# Patient Record
Sex: Female | Born: 1960
Health system: Southern US, Community
[De-identification: ages and names within clinical notes are randomized; demographics above are authoritative.]

## PROBLEM LIST (undated history)

## (undated) DIAGNOSIS — I10 Essential (primary) hypertension: Secondary | ICD-10-CM

## (undated) DIAGNOSIS — J449 Chronic obstructive pulmonary disease, unspecified: Secondary | ICD-10-CM

## (undated) HISTORY — PX: APPENDECTOMY: SHX54

## (undated) HISTORY — PX: CHOLECYSTECTOMY: SHX55

## (undated) HISTORY — PX: TUBAL LIGATION: SHX77

## (undated) HISTORY — DX: Chronic obstructive pulmonary disease, unspecified: J44.9

---

## 2003-06-30 ENCOUNTER — Ambulatory Visit (HOSPITAL_COMMUNITY): Admission: RE | Admit: 2003-06-30 | Discharge: 2003-06-30 | Payer: Self-pay | Admitting: Family Medicine

## 2003-07-20 ENCOUNTER — Encounter: Admission: RE | Admit: 2003-07-20 | Discharge: 2003-07-20 | Payer: Self-pay | Admitting: Family Medicine

## 2003-07-20 ENCOUNTER — Ambulatory Visit (HOSPITAL_COMMUNITY): Admission: RE | Admit: 2003-07-20 | Discharge: 2003-07-20 | Payer: Self-pay | Admitting: Family Medicine

## 2007-06-09 ENCOUNTER — Emergency Department (HOSPITAL_COMMUNITY): Admission: EM | Admit: 2007-06-09 | Discharge: 2007-06-09 | Payer: Self-pay | Admitting: Emergency Medicine

## 2008-08-04 ENCOUNTER — Ambulatory Visit: Payer: Self-pay

## 2010-09-21 ENCOUNTER — Encounter: Payer: Self-pay | Admitting: Family Medicine

## 2011-02-02 ENCOUNTER — Emergency Department (HOSPITAL_COMMUNITY): Payer: Self-pay

## 2011-02-02 ENCOUNTER — Observation Stay (HOSPITAL_COMMUNITY)
Admission: EM | Admit: 2011-02-02 | Discharge: 2011-02-03 | Disposition: A | Discharge status: Home / Self Care | Payer: Self-pay | Attending: General Surgery | Admitting: General Surgery

## 2011-02-02 ENCOUNTER — Other Ambulatory Visit: Payer: Self-pay | Admitting: General Surgery

## 2011-02-02 DIAGNOSIS — I10 Essential (primary) hypertension: Secondary | ICD-10-CM | POA: Insufficient documentation

## 2011-02-02 DIAGNOSIS — K3580 Unspecified acute appendicitis: Principal | ICD-10-CM | POA: Insufficient documentation

## 2011-02-02 LAB — DIFFERENTIAL
Basophils Absolute: 0.1 10*3/uL (ref 0.0–0.1)
Basophils Relative: 0 % (ref 0–1)
Eosinophils Absolute: 0.3 10*3/uL (ref 0.0–0.7)
Eosinophils Relative: 1 % (ref 0–5)
Lymphocytes Relative: 7 % — ABNORMAL LOW (ref 12–46)
Lymphs Abs: 1.5 10*3/uL (ref 0.7–4.0)
Monocytes Absolute: 1 10*3/uL (ref 0.1–1.0)
Monocytes Relative: 5 % (ref 3–12)
Neutro Abs: 17.5 10*3/uL — ABNORMAL HIGH (ref 1.7–7.7)
Neutrophils Relative %: 86 % — ABNORMAL HIGH (ref 43–77)

## 2011-02-02 LAB — CBC
HCT: 46.8 % — ABNORMAL HIGH (ref 36.0–46.0)
Hemoglobin: 15.6 g/dL — ABNORMAL HIGH (ref 12.0–15.0)
MCH: 29.1 pg (ref 26.0–34.0)
MCHC: 33.3 g/dL (ref 30.0–36.0)
MCV: 87.3 fL (ref 78.0–100.0)
Platelets: 265 10*3/uL (ref 150–400)
RBC: 5.36 MIL/uL — ABNORMAL HIGH (ref 3.87–5.11)
RDW: 13.5 % (ref 11.5–15.5)
WBC: 20.3 10*3/uL — ABNORMAL HIGH (ref 4.0–10.5)

## 2011-02-02 LAB — URINALYSIS, ROUTINE W REFLEX MICROSCOPIC
Bilirubin Urine: NEGATIVE
Glucose, UA: NEGATIVE mg/dL
Ketones, ur: NEGATIVE mg/dL
Leukocytes, UA: NEGATIVE
Nitrite: NEGATIVE
Protein, ur: NEGATIVE mg/dL
Specific Gravity, Urine: 1.025 (ref 1.005–1.030)
Urobilinogen, UA: 0.2 mg/dL (ref 0.0–1.0)
pH: 5.5 (ref 5.0–8.0)

## 2011-02-02 LAB — COMPREHENSIVE METABOLIC PANEL
ALT: 16 U/L (ref 0–35)
AST: 17 U/L (ref 0–37)
Albumin: 4.1 g/dL (ref 3.5–5.2)
Alkaline Phosphatase: 93 U/L (ref 39–117)
BUN: 10 mg/dL (ref 6–23)
CO2: 34 mEq/L — ABNORMAL HIGH (ref 19–32)
Calcium: 10.3 mg/dL (ref 8.4–10.5)
Chloride: 100 mEq/L (ref 96–112)
Creatinine, Ser: 0.85 mg/dL (ref 0.4–1.2)
GFR calc Af Amer: >60 mL/min (ref >60)
GFR calc non Af Amer: >60 mL/min (ref >60)
Glucose, Bld: 104 mg/dL — ABNORMAL HIGH (ref 70–99)
Potassium: 3.9 mEq/L (ref 3.5–5.1)
Sodium: 140 mEq/L (ref 135–145)
Total Bilirubin: 0.3 mg/dL (ref 0.3–1.2)
Total Protein: 7.6 g/dL (ref 6.0–8.3)

## 2011-02-02 LAB — URINE MICROSCOPIC-ADD ON

## 2011-02-02 LAB — LIPASE, BLOOD: Lipase: 31 U/L (ref 11–59)

## 2011-02-02 LAB — PREGNANCY, URINE: Preg Test, Ur: NEGATIVE

## 2011-02-02 MED ORDER — IOHEXOL 300 MG/ML  SOLN
100.0000 mL | Freq: Once | INTRAMUSCULAR | Status: AC | PRN
  Administered 2011-02-02: 100 mL via INTRAVENOUS

## 2011-02-03 LAB — CBC
HCT: 40 % (ref 36.0–46.0)
Hemoglobin: 13.2 g/dL (ref 12.0–15.0)
MCH: 28.8 pg (ref 26.0–34.0)
MCHC: 33 g/dL (ref 30.0–36.0)
MCV: 87.3 fL (ref 78.0–100.0)
Platelets: 184 10*3/uL (ref 150–400)
RBC: 4.58 MIL/uL (ref 3.87–5.11)
RDW: 13.7 % (ref 11.5–15.5)
WBC: 17.1 10*3/uL — ABNORMAL HIGH (ref 4.0–10.5)

## 2011-02-03 LAB — DIFFERENTIAL
Basophils Absolute: 0 10*3/uL (ref 0.0–0.1)
Basophils Relative: 0 % (ref 0–1)
Eosinophils Absolute: 0 10*3/uL (ref 0.0–0.7)
Eosinophils Relative: 0 % (ref 0–5)
Lymphocytes Relative: 7 % — ABNORMAL LOW (ref 12–46)
Lymphs Abs: 1.2 10*3/uL (ref 0.7–4.0)
Monocytes Absolute: 0.5 10*3/uL (ref 0.1–1.0)
Monocytes Relative: 3 % (ref 3–12)
Neutro Abs: 15.4 10*3/uL — ABNORMAL HIGH (ref 1.7–7.7)
Neutrophils Relative %: 90 % — ABNORMAL HIGH (ref 43–77)

## 2011-02-03 NOTE — Op Note (Signed)
  NAME:  Kerri Walker, Kerri Walker NO.:  [id number]  MEDICAL RECORD NO.:  [id number]           PATIENT TYPE:  O  LOCATION:  A341                          FACILITY:  [facility]  PHYSICIAN:  Dalia Heading, M.D.  DATE OF BIRTH:  1960/10/09  DATE OF PROCEDURE:  02/02/2011 DATE OF DISCHARGE:                              OPERATIVE REPORT   PREOPERATIVE DIAGNOSIS:  Acute appendicitis.  POSTOPERATIVE DIAGNOSIS:  Acute appendicitis.  PROCEDURE:  Laparoscopic appendectomy.  SURGEON:  Dalia Heading, MD  ANESTHESIA:  General endotracheal.  INDICATIONS:  The patient is a 50 year old white female who presents with less than 24-hour history of worsening right lower quadrant abdominal pain.  She presented to the emergency room and was found on CT scan of the abdomen and pelvis to have acute appendicitis.  The risks and benefits of the procedure including bleeding, infection, and the possibly of an open procedure were fully explained to the patient, gave informed consent.  PROCEDURE NOTE:  The patient was placed in supine position.  After induction of general endotracheal anesthesia, the abdomen was prepped and draped using the usual sterile technique with DuraPrep.  Surgical site confirmation was performed.  A supraumbilical incision was made down to fascia.  A Veress needle was introduced into the abdominal cavity and confirmation of placement was done using the saline drop test.  The abdomen was then insufflated to 16 mmHg pressure.  An 11-mm trocar was introduced into the abdominal cavity under direct visualization without difficulty.  The patient was placed in deeper Trendelenburg position.  Additional 12-mm trocar was placed in the suprapubic region.  A 5-mm trocar was placed in the left lower quadrant region.  The appendix was visualized and noted to be with early gangrene.  There was no evidence of perforation.  The mesoappendix was divided using the harmonic scalpel.  A  vascular Endo-GIA was placed across the base of the appendix and fired.  The appendix was then removed using an Endocatch bag without difficulty.  The staple line was inspected and noted to be within normal limits.  The right lower quadrant was then evacuated of all fluid and air prior to removal of the laparoscopes.  All wounds were irrigated with normal saline.  All wounds were injected with 0.5% Sensorcaine.  The supraumbilical fascia was reapproximated using an 0 Vicryl interrupted suture.  All skin incisions were closed using staples.  Betadine ointment and dry sterile dressings were applied.  All tape and needle counts were correct at the end of the procedure. The patient was extubated in the operating room and went back to the recovery room awake in stable condition.  COMPLICATIONS:  None.  SPECIMEN:  Appendix.  BLOOD LOSS:  Minimal.     Dalia Heading, M.D.     MAJ/MEDQ  D:  02/02/2011  T:  02/03/2011  Job:  [id number]  cc:   Loma Sender, MD Fax: [phone]  Electronically Signed by Franky Macho M.D. on 02/03/2011 07:47:09 PM

## 2011-02-06 NOTE — Discharge Summary (Signed)
  NAME:  Kerri Walker, Kerri Walker NO.:  [id number]  MEDICAL RECORD NO.:  [id number]  LOCATION:  A341                          FACILITY:  [facility]  PHYSICIAN:  Dalia Heading, M.D.  DATE OF BIRTH:  [birth year]  DATE OF ADMISSION:  02/02/2011 DATE OF DISCHARGE:  06/04/2012LH                              DISCHARGE SUMMARY   HOSPITAL COURSE SUMMARY:  The patient is a 50 year old white female who presented with right lower quadrant abdominal pain to the emergency room.  She was found to have acute appendicitis by CT scan.  Surgery was consulted, and she was taken to the operating room on the same day for laparoscopic appendectomy.  She tolerated the procedure well. Postoperative course has been unremarkable.  Diet was advanced without difficulty.  The patient is being discharged home on postoperative day #1 in good improving condition.  DISCHARGE INSTRUCTIONS:  The patient is to follow up with Dr. Franky Macho on February 11, 2011.  DISCHARGE MEDICATIONS: 1. Hydrochlorothiazide 25 mg p.o. daily. 2. Augmentin 875 mg p.o. b.i.d. x5 days. 3. Percocet 1-2 tablets p.o. q.4 h p.r.n. pain.  PRINCIPAL DIAGNOSES: 1. Acute appendicitis. 2. Hypertension.  PRINCIPAL PROCEDURE:  Laparoscopic appendectomy on February 02, 2011.     Dalia Heading, M.D.     MAJ/MEDQ  D:  02/03/2011  T:  02/04/2011  Job:  [id number]  Electronically Signed by Franky Macho M.D. on 02/06/2011 06:31:36 PM

## 2011-06-12 LAB — DIFFERENTIAL
Basophils Absolute: 0.1
Basophils Relative: 1
Eosinophils Absolute: 0.4
Eosinophils Relative: 3
Lymphocytes Relative: 21
Lymphs Abs: 2.7
Monocytes Absolute: 0.7
Monocytes Relative: 5
Neutro Abs: 9.4 — ABNORMAL HIGH
Neutrophils Relative %: 71

## 2011-06-12 LAB — BASIC METABOLIC PANEL
BUN: 4 — ABNORMAL LOW
CO2: 30
Calcium: 9.1
Chloride: 98
Creatinine, Ser: 0.71
GFR calc Af Amer: >60
GFR calc non Af Amer: >60
Glucose, Bld: 95
Potassium: 3.4 — ABNORMAL LOW
Sodium: 136

## 2011-06-12 LAB — POCT CARDIAC MARKERS
CKMB, poc: <1 — ABNORMAL LOW
Myoglobin, poc: 77.4
Operator id: 230251
Troponin i, poc: <0.05

## 2011-06-12 LAB — CBC
HCT: 43.4
Hemoglobin: 14.7
MCHC: 33.9
MCV: 89.7
Platelets: 367
RBC: 4.83
RDW: 14.4 — ABNORMAL HIGH
WBC: 13.3 — ABNORMAL HIGH

## 2013-06-21 ENCOUNTER — Ambulatory Visit (HOSPITAL_COMMUNITY)
Admission: RE | Admit: 2013-06-21 | Discharge: 2013-06-21 | Disposition: A | Discharge status: Home / Self Care | Payer: Self-pay | Source: Ambulatory Visit

## 2013-06-21 ENCOUNTER — Other Ambulatory Visit (HOSPITAL_COMMUNITY): Payer: Self-pay

## 2013-06-21 DIAGNOSIS — I10 Essential (primary) hypertension: Secondary | ICD-10-CM | POA: Insufficient documentation

## 2013-06-21 DIAGNOSIS — R0602 Shortness of breath: Secondary | ICD-10-CM | POA: Insufficient documentation

## 2013-06-21 DIAGNOSIS — R0989 Other specified symptoms and signs involving the circulatory and respiratory systems: Secondary | ICD-10-CM | POA: Insufficient documentation

## 2013-06-21 DIAGNOSIS — R059 Cough, unspecified: Secondary | ICD-10-CM | POA: Insufficient documentation

## 2013-06-21 DIAGNOSIS — R05 Cough: Secondary | ICD-10-CM | POA: Insufficient documentation

## 2013-08-10 ENCOUNTER — Encounter: Payer: Self-pay | Admitting: Internal Medicine

## 2014-02-01 ENCOUNTER — Encounter: Payer: Self-pay | Admitting: Internal Medicine

## 2015-04-17 ENCOUNTER — Encounter: Payer: Self-pay | Admitting: Internal Medicine

## 2015-11-14 ENCOUNTER — Ambulatory Visit (HOSPITAL_COMMUNITY)
Admission: RE | Admit: 2015-11-14 | Discharge: 2015-11-14 | Disposition: A | Discharge status: Home / Self Care | Payer: BLUE CROSS/BLUE SHIELD | Source: Ambulatory Visit | Attending: Internal Medicine | Admitting: Internal Medicine

## 2015-11-14 ENCOUNTER — Other Ambulatory Visit (HOSPITAL_COMMUNITY): Payer: Self-pay | Admitting: Internal Medicine

## 2015-11-14 DIAGNOSIS — R0789 Other chest pain: Secondary | ICD-10-CM

## 2015-11-14 DIAGNOSIS — R079 Chest pain, unspecified: Secondary | ICD-10-CM | POA: Diagnosis not present

## 2016-03-23 ENCOUNTER — Encounter (HOSPITAL_COMMUNITY): Payer: Self-pay | Admitting: Emergency Medicine

## 2016-03-23 ENCOUNTER — Emergency Department (HOSPITAL_COMMUNITY)
Admission: EM | Admit: 2016-03-23 | Discharge: 2016-03-23 | Disposition: A | Discharge status: Home / Self Care | Payer: BLUE CROSS/BLUE SHIELD | Attending: Emergency Medicine | Admitting: Emergency Medicine

## 2016-03-23 DIAGNOSIS — L259 Unspecified contact dermatitis, unspecified cause: Secondary | ICD-10-CM

## 2016-03-23 DIAGNOSIS — I10 Essential (primary) hypertension: Secondary | ICD-10-CM | POA: Insufficient documentation

## 2016-03-23 DIAGNOSIS — F17200 Nicotine dependence, unspecified, uncomplicated: Secondary | ICD-10-CM | POA: Insufficient documentation

## 2016-03-23 DIAGNOSIS — R21 Rash and other nonspecific skin eruption: Secondary | ICD-10-CM | POA: Diagnosis present

## 2016-03-23 HISTORY — DX: Essential (primary) hypertension: I10

## 2016-03-23 MED ORDER — DEXAMETHASONE SODIUM PHOSPHATE 4 MG/ML IJ SOLN
10.0000 mg | Freq: Once | INTRAMUSCULAR | Status: AC
Start: 2016-03-23 — End: 2016-03-23
  Administered 2016-03-23: 10 mg via INTRAMUSCULAR
  Filled 2016-03-23: qty 3

## 2016-03-23 MED ORDER — PREDNISONE 10 MG PO TABS: ORAL_TABLET | ORAL | 0 refills | Status: DC

## 2016-03-23 NOTE — ED Provider Notes (Signed)
[facility]-EMERGENCY DEPT Provider Note   CSN: [id number] Arrival date & time: 03/23/16  1039  First Provider Contact:  First MD Initiated Contact with Patient 03/23/16 11:57   By signing my name below, I, Levon Hedger, attest that this documentation has been prepared under the direction and in the presence of non-physician practitioner, Artavis Cowie Triplet, PA-C. Electronically Signed: Levon Hedger, Scribe. 03/23/2016. 12:10 PM.   History   Chief Complaint Chief Complaint  Patient presents with  . Rash    HPI TANELL WITTSTRUCK is a 55 y.o. female complaining of gradually worsening itching, red rash to chest, fingers, lower back, and face onset two days ago. She has used Calamine lotion and Benadryl with no relief. She denies any new foods, medications, soaps, detergents, animal contact, or plant contact.  Pt denies trouble swallowing, facial swelling, shortness of breath. She is otherwise in her normal health and denies any history of diabetes.   The history is provided by the patient. No language interpreter was used.    Past Medical History:  Diagnosis Date  . Hypertension     There are no active problems to display for this patient.   Past Surgical History:  Procedure Laterality Date  . APPENDECTOMY    . CESAREAN SECTION    . CHOLECYSTECTOMY      OB History    No data available       Home Medications    Prior to Admission medications   Not on File    Family History No family history on file.  Social History Social History  Substance Use Topics  . Smoking status: Current Every Day Smoker    Packs/day: 1.00  . Smokeless tobacco: Never Used  . Alcohol use No     Allergies   Review of patient's allergies indicates not on file.   Review of Systems Review of Systems  Constitutional: Negative for activity change, appetite change, chills and fever.  HENT: Negative for facial swelling, sore throat and trouble swallowing.   Respiratory: Negative for chest  tightness, shortness of breath and wheezing.   Musculoskeletal: Negative for neck pain and neck stiffness.  Skin: Positive for rash. Negative for wound.  Neurological: Negative for dizziness, weakness, numbness and headaches.  All other systems reviewed and are negative.    Physical Exam Updated Vital Signs BP 152/84 (BP Location: Left Arm)   Pulse (!) 59   Temp 98.4 F (36.9 C)   Resp 16   Ht 5' (1.524 m)   Wt 135 lb (61.2 kg)   SpO2 100%   BMI 26.37 kg/m   Physical Exam  Constitutional: She is oriented to person, place, and time. Vital signs are normal. She appears well-developed and well-nourished.  Non-toxic appearance. No distress.  Afebrile, nontoxic, NAD  HENT:  Head: Normocephalic and atraumatic.  Mouth/Throat: Uvula is midline, oropharynx is clear and moist and mucous membranes are normal. No uvula swelling.  Eyes: Conjunctivae and EOM are normal. Right eye exhibits no discharge. Left eye exhibits no discharge.  Neck: Normal range of motion. Neck supple.  Cardiovascular: Normal rate and intact distal pulses.   Pulmonary/Chest: Effort normal. No respiratory distress.  Abdominal: Normal appearance.  Musculoskeletal: Normal range of motion. She exhibits no edema.  Neurological: She is alert and oriented to person, place, and time. She has normal strength. No sensory deficit.  Skin: Skin is warm, dry and intact. Rash (erythematous maculopapular rash to face, neck and upper trunk. No vesicles or pustules.  ) noted.  Psychiatric:  She has a normal mood and affect. Her behavior is normal.  Nursing note and vitals reviewed.    ED Treatments / Results  DIAGNOSTIC STUDIES:  Oxygen Saturation is 100% on RA, normal by my interpretation.    COORDINATION OF CARE:  12:04 PM Discussed treatment plan with pt at bedside and pt agreed to plan.   Procedures Procedures   Medications Ordered in ED Medications - No data to display   Initial Impression / Assessment and Plan /  ED Course  I have reviewed the triage vital signs and the nursing notes.  Pertinent labs & imaging results that were available during my care of the patient were reviewed by me and considered in my medical decision making (see chart for details).  Clinical Course   Pt with likely contact dermatitis.  Non-toxic.  No edema.  Agrees to prednisone and OTC benadryl  I personally performed the services described in this documentation, which was scribed in my presence. The recorded information has been reviewed and is accurate.   Final Clinical Impressions(s) / ED Diagnoses   Final diagnoses:  Contact dermatitis    New Prescriptions Discharge Medication List as of 03/23/2016 12:11 PM    START taking these medications   Details  predniSONE (DELTASONE) 10 MG tablet Take 6 tablets day one, 5 tablets day two, 4 tablets day three, 3 tablets day four, 2 tablets day five, then 1 tablet day six, Print         Pauline Aus, PA-C 03/24/16 2037    Mancel Bale, MD 03/25/16 [phone]

## 2016-03-23 NOTE — ED Triage Notes (Signed)
Pt c/o red itching rash to face and torso since Friday.

## 2016-03-23 NOTE — Discharge Instructions (Signed)
Continue taking benadryl one capsule every 4-6 hrs for 3-4 days.  Return here for any worsening symptoms such a swelling, difficulty swallowing or breathing

## 2017-09-22 ENCOUNTER — Other Ambulatory Visit (HOSPITAL_COMMUNITY): Payer: Self-pay | Admitting: Respiratory Therapy

## 2017-09-22 DIAGNOSIS — R0602 Shortness of breath: Secondary | ICD-10-CM

## 2017-09-23 ENCOUNTER — Other Ambulatory Visit (HOSPITAL_COMMUNITY): Payer: Self-pay | Admitting: Family Medicine

## 2017-09-23 DIAGNOSIS — Z1231 Encounter for screening mammogram for malignant neoplasm of breast: Secondary | ICD-10-CM

## 2017-10-01 ENCOUNTER — Ambulatory Visit (HOSPITAL_COMMUNITY)
Admission: RE | Admit: 2017-10-01 | Discharge: 2017-10-01 | Disposition: A | Discharge status: Home / Self Care | Payer: BLUE CROSS/BLUE SHIELD | Source: Ambulatory Visit | Attending: Internal Medicine | Admitting: Internal Medicine

## 2017-10-01 DIAGNOSIS — R0602 Shortness of breath: Secondary | ICD-10-CM | POA: Diagnosis present

## 2017-10-01 DIAGNOSIS — J449 Chronic obstructive pulmonary disease, unspecified: Secondary | ICD-10-CM | POA: Diagnosis not present

## 2017-10-01 LAB — SPIROMETRY WITH GRAPH
FEF 25-75 Pre: 0.53 L/sec
FEF2575-%Pred-Pre: 23 %
FEV1-%Pred-Pre: 46 %
FEV1-Pre: 1.07 L
FEV1FVC-%PRED-PRE: 71 %
FEV6-%PRED-PRE: 66 %
FEV6-PRE: 1.87 L
FEV6FVC-%Pred-Pre: 101 %
FVC-%Pred-Pre: 65 %
FVC-PRE: 1.91 L
PRE FEV6/FVC RATIO: 98 %
Pre FEV1/FVC ratio: 56 %

## 2017-10-07 ENCOUNTER — Ambulatory Visit (HOSPITAL_COMMUNITY): Payer: BLUE CROSS/BLUE SHIELD

## 2017-10-22 ENCOUNTER — Encounter (INDEPENDENT_AMBULATORY_CARE_PROVIDER_SITE_OTHER): Payer: Self-pay | Admitting: *Deleted

## 2017-11-05 ENCOUNTER — Other Ambulatory Visit (HOSPITAL_COMMUNITY)
Admission: RE | Admit: 2017-11-05 | Discharge: 2017-11-05 | Disposition: A | Discharge status: Home / Self Care | Payer: BLUE CROSS/BLUE SHIELD | Source: Ambulatory Visit | Attending: Adult Health | Admitting: Adult Health

## 2017-11-05 ENCOUNTER — Encounter: Payer: Self-pay | Admitting: Adult Health

## 2017-11-05 ENCOUNTER — Ambulatory Visit (INDEPENDENT_AMBULATORY_CARE_PROVIDER_SITE_OTHER): Payer: BLUE CROSS/BLUE SHIELD | Admitting: Adult Health

## 2017-11-05 VITALS — BP 134/88 | HR 70 | Ht 60.0 in | Wt 137.0 lb

## 2017-11-05 DIAGNOSIS — Z1212 Encounter for screening for malignant neoplasm of rectum: Secondary | ICD-10-CM

## 2017-11-05 DIAGNOSIS — Z01419 Encounter for gynecological examination (general) (routine) without abnormal findings: Secondary | ICD-10-CM | POA: Insufficient documentation

## 2017-11-05 DIAGNOSIS — Z1211 Encounter for screening for malignant neoplasm of colon: Secondary | ICD-10-CM

## 2017-11-05 LAB — HEMOCCULT GUIAC POC 1CARD (OFFICE): Fecal Occult Blood, POC: NEGATIVE

## 2017-11-05 NOTE — Patient Instructions (Signed)
Physical in 1 year Pap in 3 years if normal Get mammogram

## 2017-11-05 NOTE — Progress Notes (Signed)
Subjective:     Patient ID: Kerri Walker, female   DOB: Feb 08, 1961, 57 y.o.   MRN: [id number]  HPI Kerri Walker is a 57 year old white female, married, in for pap and pelvic exam. PCP is Dr Selena BattenKim.   Review of Systems Patient denies any headaches, hearing loss, fatigue, blurred vision, shortness of breath, chest pain, abdominal pain, problems with bowel movements, urination, or intercourse(not having often). No joint pain or mood swings. Reviewed past medical,surgical, social and family history. Reviewed medications and allergies.     Objective:   Physical Exam BP 134/88 (BP Location: Left Arm, Patient Position: Sitting, Cuff Size: Normal)   Pulse 70   Ht 5' (1.524 m)   Wt 137 lb (62.1 kg)   BMI 26.76 kg/m    Skin warm and dry.Pelvic: external genitalia is normal in appearance no lesions, vagina:pale, loss of rugae and moisture,urethra has no lesions or masses noted, cervix:smooth and atrophic, uterus: normal size, shape and contour, non tender, no masses felt, adnexa: no masses or tenderness noted. Bladder is non tender and no masses felt. On rectal exam, no polyps or masses felt and hemoccult is negative.  Assessment:     1. Encounter for gynecological examination with Papanicolaou smear of cervix   2. Screening for colorectal cancer       Plan:      Physical in 1 year Pap in 3 years if normal Get mammogram  Labs with PCP

## 2017-11-06 LAB — CYTOLOGY - PAP
Adequacy: ABSENT
Diagnosis: NEGATIVE
HPV: NOT DETECTED

## 2017-11-12 ENCOUNTER — Encounter (INDEPENDENT_AMBULATORY_CARE_PROVIDER_SITE_OTHER): Payer: Self-pay | Admitting: *Deleted

## 2017-12-21 ENCOUNTER — Encounter (INDEPENDENT_AMBULATORY_CARE_PROVIDER_SITE_OTHER): Payer: Self-pay | Admitting: *Deleted

## 2017-12-25 ENCOUNTER — Other Ambulatory Visit (INDEPENDENT_AMBULATORY_CARE_PROVIDER_SITE_OTHER): Payer: Self-pay | Admitting: *Deleted

## 2017-12-25 DIAGNOSIS — Z1211 Encounter for screening for malignant neoplasm of colon: Secondary | ICD-10-CM | POA: Insufficient documentation

## 2018-02-16 ENCOUNTER — Telehealth (INDEPENDENT_AMBULATORY_CARE_PROVIDER_SITE_OTHER): Payer: Self-pay | Admitting: *Deleted

## 2018-02-16 NOTE — Telephone Encounter (Signed)
Referring MD/PCP: kim   Procedure: tcs  Reason/Indication:  screening  Has patient had this procedure before?  Yes, 2004  If so, when, by whom and where?    Is there a family history of colon cancer?  no  Who?  What age when diagnosed?    Is patient diabetic?   no      Does patient have prosthetic heart valve or mechanical valve?  no  Do you have a pacemaker?  no  Has patient ever had endocarditis? no  Has patient had joint replacement within last 12 months?  no  Is patient constipated or do they take laxatives? no  Does patient have a history of alcohol/drug use?  no  Is patient on blood thinner such as Coumadin, Plavix and/or Aspirin? no  Medications: hctz 25 mg daily, losartan 25 mg daily, bupropion 150 mg daily, omeprazole 20 mg daily, montelukast 10 mg daily, spiriva  Allergies: nkda  Medication Adjustment per Dr Keane Policeehman/Terri Setzer, NP:   Procedure date & time:

## 2018-03-13 ENCOUNTER — Encounter (HOSPITAL_COMMUNITY): Payer: Self-pay | Admitting: Emergency Medicine

## 2018-03-13 ENCOUNTER — Emergency Department (HOSPITAL_COMMUNITY)
Admission: EM | Admit: 2018-03-13 | Discharge: 2018-03-13 | Disposition: A | Discharge status: Home / Self Care | Payer: BLUE CROSS/BLUE SHIELD | Attending: Emergency Medicine | Admitting: Emergency Medicine

## 2018-03-13 ENCOUNTER — Other Ambulatory Visit: Payer: Self-pay

## 2018-03-13 DIAGNOSIS — F17210 Nicotine dependence, cigarettes, uncomplicated: Secondary | ICD-10-CM | POA: Insufficient documentation

## 2018-03-13 DIAGNOSIS — Z79899 Other long term (current) drug therapy: Secondary | ICD-10-CM | POA: Diagnosis not present

## 2018-03-13 DIAGNOSIS — L237 Allergic contact dermatitis due to plants, except food: Secondary | ICD-10-CM | POA: Diagnosis not present

## 2018-03-13 DIAGNOSIS — I10 Essential (primary) hypertension: Secondary | ICD-10-CM | POA: Insufficient documentation

## 2018-03-13 DIAGNOSIS — J449 Chronic obstructive pulmonary disease, unspecified: Secondary | ICD-10-CM | POA: Insufficient documentation

## 2018-03-13 MED ORDER — FAMOTIDINE 20 MG PO TABS: 20.0000 mg | ORAL_TABLET | Freq: Two times a day (BID) | ORAL | 0 refills | Status: DC

## 2018-03-13 MED ORDER — PREDNISONE 10 MG PO TABS: ORAL_TABLET | ORAL | 0 refills | Status: DC

## 2018-03-13 MED ORDER — DEXAMETHASONE SODIUM PHOSPHATE 10 MG/ML IJ SOLN
10.0000 mg | Freq: Once | INTRAMUSCULAR | Status: AC
  Administered 2018-03-13: 10 mg via INTRAMUSCULAR
  Filled 2018-03-13: qty 1

## 2018-03-13 NOTE — ED Triage Notes (Signed)
Patient c/o being exposed to poison ivy and breaking out in rash. Per patient started after working outside on Wednesday. Per patient itching. Per patient using over-the-counter medication with some relief.

## 2018-03-13 NOTE — Discharge Instructions (Addendum)
Take your next dose of the prednisone prescription tomorrow morning.  Start taking Pepcid twice daily which will help with this histamine reaction as discussed.  In addition continue using Benadryl for itch relief.  Cool compresses to especially itchy areas and/or anti-itch cream can also be helpful for itchy areas.  Get rechecked here or by your doctor for any worsening or persistent symptoms.  You should start noticing improvement within the next 1 to 2 days as this prednisone becomes effective.  As discussed, your blood pressure is elevated today and this may be because you just took your blood pressure medication, however should have your blood pressure rechecked within the next week to make sure it is under better control.

## 2018-03-13 NOTE — ED Notes (Signed)
Working in [place] [employer], burned some brush  Now with several  Day hx of rash that itches and continues to spread on body Rash to hands legs, chest and face  States that she has used OTC meds and creams without relief

## 2018-03-15 NOTE — ED Provider Notes (Signed)
[facility] EMERGENCY DEPARTMENT Provider Note   CSN: [id number] Arrival date & time: 03/13/18  1118     History   Chief Complaint Chief Complaint  Patient presents with  . Poison Ivy    HPI Kerri Walker is a 57 y.o. female.  The history is provided by the patient.  Poison Kerri Walker  This is a new problem. The current episode started more than 2 days ago (she burned brush which probably contained poison ivy 3 days ago.). The problem occurs constantly. The problem has not changed since onset.Pertinent negatives include no shortness of breath. Exacerbated by: gets worse when outside in the heat. The symptoms are relieved by medications (oartial relief with benadryl. has also used calamine lotion).    Past Medical History:  Diagnosis Date  . COPD (chronic obstructive pulmonary disease) (HCC)   . Hypertension     Patient Active Problem List   Diagnosis Date Noted  . Special screening for malignant neoplasms, colon 12/25/2017  . Encounter for gynecological examination with Papanicolaou smear of cervix 11/05/2017    Past Surgical History:  Procedure Laterality Date  . APPENDECTOMY    . CESAREAN SECTION    . CHOLECYSTECTOMY    . TUBAL LIGATION       OB History    Gravida  2   Para  2   Term  2   Preterm      AB      Living  1     SAB      TAB      Ectopic      Multiple      Live Births  1            Home Medications    Prior to Admission medications   Medication Sig Start Date End Date Taking? Authorizing Provider  buPROPion (ZYBAN) 150 MG 12 hr tablet Take 150 mg by mouth 2 (two) times daily.  10/08/17   [provider]  famotidine (PEPCID) 20 MG tablet Take 1 tablet (20 mg total) by mouth 2 (two) times daily. 03/13/18   Burgess Amor, PA-C  fluticasone (FLONASE) 50 MCG/ACT nasal spray PLACE 2 SPRAYS INTO [facility] NOSTRIL QD 08/17/17   [provider]  hydrochlorothiazide (HYDRODIURIL) 25 MG tablet TK 1 T PO QD FOR BP 10/16/17    [provider]  losartan (COZAAR) 25 MG tablet TK 1 T PO QD FOR BP 10/09/17   [provider]  omeprazole (PRILOSEC) 20 MG capsule TK 1 C PO QD FOR REFLUX 10/14/17   [provider]  predniSONE (DELTASONE) 10 MG tablet Take 6 tabs daily by mouth for 2 day,  Then 5 tabs daily for 2 days,  4 tabs daily for 2 days,  3 tabs daily for 2 days,  2 tabs daily for 2 days,  Then 1 tab daily for 2 days. 03/13/18   Burgess Amor, PA-C  SPIRIVA RESPIMAT 2.5 MCG/ACT AERS INL 2 PFS ITL QD 10/08/17   [provider]  traZODone (DESYREL) 50 MG tablet TK 1 TO 2 TS PO HS FOR SLP 10/07/17   [provider]    Family History Family History  Problem Relation Age of Onset  . Emphysema Paternal Grandfather   . Stroke Paternal Grandmother   . Dementia Father   . Miscarriages / [place] Daughter     Social History Social History   Tobacco Use  . Smoking status: Current Every Day Smoker    Packs/day: 0.00  Years: 43.00    Pack years: 0.00    Types: Cigarettes  . Smokeless tobacco: Never Used  Substance Use Topics  . Alcohol use: Yes    Comment: rarely  . Drug use: No     Allergies   Patient has no known allergies.   Review of Systems Review of Systems  Constitutional: Negative for chills and fever.  Respiratory: Negative for shortness of breath and wheezing.   Skin: Positive for rash.  Neurological: Negative for numbness.     Physical Exam Updated Vital Signs BP (!) 161/92 (BP Location: Left Arm)   Pulse (!) 57   Temp 97.9 F (36.6 C) (Oral)   Resp 16   Ht 5' (1.524 m)   Wt 61.2 kg (135 lb)   SpO2 98%   BMI 26.37 kg/m   Physical Exam  Constitutional: She appears well-developed and well-nourished. No distress.  HENT:  Head: Normocephalic.  Neck: Neck supple.  Cardiovascular: Normal rate.  Pulmonary/Chest: Effort normal. She has no wheezes.  Musculoskeletal: Normal range of motion. She exhibits no edema.  Skin: Skin is warm. Rash noted.  Rash is vesicular.  Scattered vesicular rash on hands, forearms, upper chest and face. No drainage. Excoriations present.     ED Treatments / Results  Labs (all labs ordered are listed, but only abnormal results are displayed) Labs Reviewed - No data to display  EKG None  Radiology No results found.  Procedures Procedures (including critical care time)  Medications Ordered in ED Medications  dexamethasone (DECADRON) injection 10 mg (10 mg Intramuscular Given 03/13/18 1154)     Initial Impression / Assessment and Plan / ED Course  I have reviewed the triage vital signs and the nursing notes.  Pertinent labs & imaging results that were available during my care of the patient were reviewed by me and considered in my medical decision making (see chart for details).     Steroids, pepcid, continued benadryl. Advised cool compresses, anti itch cream, calamine if areas become wet or draining.  Vesicles currently tiny and intact. No signs of superficial infection.  Discussed other home tx for sx relief. Prn f/u with pcp.  Final Clinical Impressions(s) / ED Diagnoses   Final diagnoses:  Allergic dermatitis due to poison ivy    ED Discharge Orders        Ordered    predniSONE (DELTASONE) 10 MG tablet     03/13/18 1153    famotidine (PEPCID) 20 MG tablet  2 times daily     03/13/18 1153       Burgess Amordol, Raijon Lindfors, PA-C 03/15/18 1600    Donnetta Hutchingook, Brian, MD 03/16/18 [phone]

## 2018-04-07 ENCOUNTER — Encounter (HOSPITAL_COMMUNITY): Admission: RE | Payer: Self-pay | Source: Ambulatory Visit

## 2018-04-07 ENCOUNTER — Ambulatory Visit (HOSPITAL_COMMUNITY)
Admission: RE | Admit: 2018-04-07 | Payer: BLUE CROSS/BLUE SHIELD | Source: Ambulatory Visit | Admitting: Internal Medicine

## 2018-04-07 SURGERY — COLONOSCOPY
Anesthesia: Moderate Sedation

## 2018-06-23 ENCOUNTER — Encounter (HOSPITAL_COMMUNITY): Payer: Self-pay | Admitting: Emergency Medicine

## 2018-06-23 ENCOUNTER — Emergency Department (HOSPITAL_COMMUNITY)
Admission: EM | Admit: 2018-06-23 | Discharge: 2018-06-23 | Disposition: A | Discharge status: Home / Self Care | Payer: BLUE CROSS/BLUE SHIELD | Attending: Emergency Medicine | Admitting: Emergency Medicine

## 2018-06-23 ENCOUNTER — Other Ambulatory Visit: Payer: Self-pay

## 2018-06-23 DIAGNOSIS — Y93H2 Activity, gardening and landscaping: Secondary | ICD-10-CM | POA: Insufficient documentation

## 2018-06-23 DIAGNOSIS — F17210 Nicotine dependence, cigarettes, uncomplicated: Secondary | ICD-10-CM | POA: Insufficient documentation

## 2018-06-23 DIAGNOSIS — Y92017 Garden or yard in single-family (private) house as the place of occurrence of the external cause: Secondary | ICD-10-CM | POA: Diagnosis not present

## 2018-06-23 DIAGNOSIS — T161XXA Foreign body in right ear, initial encounter: Secondary | ICD-10-CM

## 2018-06-23 DIAGNOSIS — I10 Essential (primary) hypertension: Secondary | ICD-10-CM | POA: Diagnosis not present

## 2018-06-23 DIAGNOSIS — X58XXXA Exposure to other specified factors, initial encounter: Secondary | ICD-10-CM | POA: Diagnosis not present

## 2018-06-23 DIAGNOSIS — J449 Chronic obstructive pulmonary disease, unspecified: Secondary | ICD-10-CM | POA: Diagnosis not present

## 2018-06-23 DIAGNOSIS — Z79899 Other long term (current) drug therapy: Secondary | ICD-10-CM | POA: Insufficient documentation

## 2018-06-23 DIAGNOSIS — Y999 Unspecified external cause status: Secondary | ICD-10-CM | POA: Diagnosis not present

## 2018-06-23 NOTE — Discharge Instructions (Addendum)
Return as needed

## 2018-06-23 NOTE — ED Triage Notes (Signed)
Pt c/o bug in right ear, noticed today.

## 2018-06-23 NOTE — ED Notes (Signed)
Ear irrigated and small insect retreived

## 2018-06-23 NOTE — ED Provider Notes (Signed)
[facility] EMERGENCY DEPARTMENT Provider Note   CSN: [id number] Arrival date & time: 06/23/18  1331     History   Chief Complaint Chief Complaint  Patient presents with  . Otalgia    HPI Kerri Walker is a 57 y.o. female.  HPI   Kerri Walker is a 57 y.o. female who presents to the Emergency Department complaining of foreign body sensation to right ear.  States that she was working outside when she felt an insect fly inside her ear.  She complains of discomfort to her ear and intermittent "moving and scratching" inside her ear.  Denies bleeding, decreased hearing and other symptoms  Past Medical History:  Diagnosis Date  . COPD (chronic obstructive pulmonary disease) (HCC)   . Hypertension     Patient Active Problem List   Diagnosis Date Noted  . Special screening for malignant neoplasms, colon 12/25/2017  . Encounter for gynecological examination with Papanicolaou smear of cervix 11/05/2017    Past Surgical History:  Procedure Laterality Date  . APPENDECTOMY    . CESAREAN SECTION    . CHOLECYSTECTOMY    . TUBAL LIGATION       OB History    Gravida  2   Para  2   Term  2   Preterm      AB      Living  1     SAB      TAB      Ectopic      Multiple      Live Births  1            Home Medications    Prior to Admission medications   Medication Sig Start Date End Date Taking? Authorizing Provider  buPROPion (ZYBAN) 150 MG 12 hr tablet Take 150 mg by mouth 2 (two) times daily.  10/08/17   [provider]  famotidine (PEPCID) 20 MG tablet Take 1 tablet (20 mg total) by mouth 2 (two) times daily. 03/13/18   Burgess Amor, PA-C  fluticasone (FLONASE) 50 MCG/ACT nasal spray PLACE 2 SPRAYS INTO [facility] NOSTRIL QD 08/17/17   [provider]  hydrochlorothiazide (HYDRODIURIL) 25 MG tablet TK 1 T PO QD FOR BP 10/16/17   [provider]  losartan (COZAAR) 25 MG tablet TK 1 T PO QD FOR BP 10/09/17   [provider]    omeprazole (PRILOSEC) 20 MG capsule TK 1 C PO QD FOR REFLUX 10/14/17   [provider]  predniSONE (DELTASONE) 10 MG tablet Take 6 tabs daily by mouth for 2 day,  Then 5 tabs daily for 2 days,  4 tabs daily for 2 days,  3 tabs daily for 2 days,  2 tabs daily for 2 days,  Then 1 tab daily for 2 days. 03/13/18   Burgess Amor, PA-C  SPIRIVA RESPIMAT 2.5 MCG/ACT AERS INL 2 PFS ITL QD 10/08/17   [provider]  traZODone (DESYREL) 50 MG tablet TK 1 TO 2 TS PO HS FOR SLP 10/07/17   [provider]    Family History Family History  Problem Relation Age of Onset  . Emphysema Paternal Grandfather   . Stroke Paternal Grandmother   . Dementia Father   . Miscarriages / [place] Daughter     Social History Social History   Tobacco Use  . Smoking status: Current Every Day Smoker    Packs/day: 0.00    Years: 43.00    Pack years: 0.00    Types: Cigarettes  .  Smokeless tobacco: Never Used  Substance Use Topics  . Alcohol use: Yes    Comment: rarely  . Drug use: No     Allergies   Patient has no known allergies.   Review of Systems Review of Systems  Constitutional: Negative for fever.  HENT: Positive for ear pain. Negative for congestion, ear discharge, facial swelling and hearing loss.   Skin: Negative for rash.  Neurological: Negative for dizziness and headaches.     Physical Exam Updated Vital Signs BP (!) 156/88 (BP Location: Right Arm)   Pulse 67   Temp 97.9 F (36.6 C) (Oral)   Resp 18   SpO2 99%   Physical Exam  Constitutional: She appears well-developed and well-nourished. No distress.  Pt is calm, no distress  HENT:  Head: Atraumatic.  Right Ear: Tympanic membrane normal. A foreign body is present.  Left Ear: Tympanic membrane normal.  Mouth/Throat: Uvula is midline, oropharynx is clear and moist and mucous membranes are normal.  Very small insect in the right ear canal.  No bleeding or abrasions to the canal  Neck: Normal range of  motion. Neck supple.  Cardiovascular: Normal rate and regular rhythm.  Pulmonary/Chest: Effort normal and breath sounds normal. No respiratory distress.  Musculoskeletal: Normal range of motion.  Neurological: She is alert.  Skin: Skin is warm.  Psychiatric: She has a normal mood and affect.  Nursing note and vitals reviewed.    ED Treatments / Results  Labs (all labs ordered are listed, but only abnormal results are displayed) Labs Reviewed - No data to display  EKG None  Radiology No results found.  Procedures Procedures (including critical care time)  Medications Ordered in ED Medications - No data to display   Initial Impression / Assessment and Plan / ED Course  I have reviewed the triage vital signs and the nursing notes.  Pertinent labs & imaging results that were available during my care of the patient were reviewed by me and considered in my medical decision making (see chart for details).     Small insect irrigated from right ear w/o difficulty by nursing staff.  Irrigated with nml saline.    On recheck, TM remains intact  Final Clinical Impressions(s) / ED Diagnoses   Final diagnoses:  Foreign body of right ear, initial encounter    ED Discharge Orders    None       Pauline Aus, PA-C 06/26/18 1610    Raeford Razor, MD 06/26/18 [phone]

## 2019-11-28 ENCOUNTER — Other Ambulatory Visit: Payer: Self-pay

## 2019-11-28 ENCOUNTER — Ambulatory Visit: Payer: Self-pay | Attending: Internal Medicine

## 2019-11-28 DIAGNOSIS — Z20822 Contact with and (suspected) exposure to covid-19: Secondary | ICD-10-CM | POA: Insufficient documentation

## 2019-11-30 LAB — NOVEL CORONAVIRUS, NAA: SARS-CoV-2, NAA: NOT DETECTED

## 2019-11-30 LAB — SARS-COV-2, NAA 2 DAY TAT

## 2019-12-01 ENCOUNTER — Telehealth: Payer: Self-pay

## 2019-12-01 NOTE — Telephone Encounter (Signed)
Pt. Given COVID 19 results, verbalizes understanding. 

## 2021-01-14 ENCOUNTER — Emergency Department (HOSPITAL_COMMUNITY): Payer: 59

## 2021-01-14 ENCOUNTER — Other Ambulatory Visit: Payer: Self-pay

## 2021-01-14 ENCOUNTER — Emergency Department (HOSPITAL_COMMUNITY)
Admission: EM | Admit: 2021-01-14 | Discharge: 2021-01-14 | Disposition: A | Discharge status: Home / Self Care | Payer: 59 | Attending: Emergency Medicine | Admitting: Emergency Medicine

## 2021-01-14 ENCOUNTER — Encounter (HOSPITAL_COMMUNITY): Payer: Self-pay | Admitting: *Deleted

## 2021-01-14 DIAGNOSIS — J449 Chronic obstructive pulmonary disease, unspecified: Secondary | ICD-10-CM | POA: Diagnosis not present

## 2021-01-14 DIAGNOSIS — S52125A Nondisplaced fracture of head of left radius, initial encounter for closed fracture: Secondary | ICD-10-CM | POA: Diagnosis not present

## 2021-01-14 DIAGNOSIS — W19XXXA Unspecified fall, initial encounter: Secondary | ICD-10-CM | POA: Diagnosis not present

## 2021-01-14 DIAGNOSIS — I10 Essential (primary) hypertension: Secondary | ICD-10-CM | POA: Diagnosis not present

## 2021-01-14 DIAGNOSIS — F17210 Nicotine dependence, cigarettes, uncomplicated: Secondary | ICD-10-CM | POA: Diagnosis not present

## 2021-01-14 DIAGNOSIS — Z79899 Other long term (current) drug therapy: Secondary | ICD-10-CM | POA: Insufficient documentation

## 2021-01-14 DIAGNOSIS — S59912A Unspecified injury of left forearm, initial encounter: Secondary | ICD-10-CM | POA: Diagnosis present

## 2021-01-14 MED ORDER — ACETAMINOPHEN 325 MG PO TABS
650.0000 mg | ORAL_TABLET | Freq: Once | ORAL | Status: AC
  Administered 2021-01-14: 650 mg via ORAL
  Filled 2021-01-14: qty 2

## 2021-01-14 NOTE — ED Provider Notes (Addendum)
[facility] EMERGENCY DEPARTMENT Provider Note   CSN: [id number] Arrival date & time: 01/14/21  1040     History Chief Complaint  Patient presents with  . Fall    Kerri Walker is a 60 y.o. female.  Patient presents ER chief complaint of left forearm pain.  She states that she fell onto her left forearm 2 days ago while her husband was trying to set her up.  She denies headache or neck pain or back pain.  Has been having consistent left forearm pain and presents to the ER for evaluation.  Denies fevers or cough vomiting or diarrhea.        Past Medical History:  Diagnosis Date  . COPD (chronic obstructive pulmonary disease) (HCC)   . Hypertension     Patient Active Problem List   Diagnosis Date Noted  . Special screening for malignant neoplasms, colon 12/25/2017  . Encounter for gynecological examination with Papanicolaou smear of cervix 11/05/2017    Past Surgical History:  Procedure Laterality Date  . APPENDECTOMY    . CESAREAN SECTION    . CHOLECYSTECTOMY    . TUBAL LIGATION       OB History    Gravida  2   Para  2   Term  2   Preterm      AB      Living  1     SAB      IAB      Ectopic      Multiple      Live Births  1           Family History  Problem Relation Age of Onset  . Emphysema Paternal Grandfather   . Stroke Paternal Grandmother   . Dementia Father   . Miscarriages / [place] Daughter     Social History   Tobacco Use  . Smoking status: Current Every Day Smoker    Packs/day: 0.00    Years: 43.00    Pack years: 0.00    Types: Cigarettes  . Smokeless tobacco: Never Used  Vaping Use  . Vaping Use: Never used  Substance Use Topics  . Alcohol use: Yes    Comment: rarely  . Drug use: No    Home Medications Prior to Admission medications   Medication Sig Start Date End Date Taking? Authorizing Provider  albuterol (VENTOLIN HFA) 108 (90 Base) MCG/ACT inhaler Inhale 2 puffs into the lungs every 4 (four)  hours as needed for wheezing or shortness of breath. 01/08/21  Yes [provider]  hydrochlorothiazide (HYDRODIURIL) 25 MG tablet Take 25 mg by mouth daily. 10/16/17  Yes [provider]  losartan (COZAAR) 25 MG tablet Take 25 mg by mouth daily. 10/09/17  Yes [provider]  montelukast (SINGULAIR) 10 MG tablet Take 1 tablet by mouth daily. 12/19/20  Yes [provider]  SPIRIVA RESPIMAT 2.5 MCG/ACT AERS Inhale 2 puffs into the lungs daily. 10/08/17  Yes [provider]  traZODone (DESYREL) 50 MG tablet Take 50-100 mg by mouth at bedtime. 10/07/17  Yes [provider]  famotidine (PEPCID) 20 MG tablet Take 1 tablet (20 mg total) by mouth 2 (two) times daily. Patient not taking: No sig reported 03/13/18   Burgess Amor, PA-C  predniSONE (DELTASONE) 10 MG tablet Take 6 tabs daily by mouth for 2 day,  Then 5 tabs daily for 2 days,  4 tabs daily for 2 days,  3 tabs daily for 2 days,  2 tabs daily  for 2 days,  Then 1 tab daily for 2 days. Patient not taking: No sig reported 03/13/18   Burgess Amor, PA-C    Allergies    Patient has no known allergies.  Review of Systems   Review of Systems  Constitutional: Negative for fever.  HENT: Negative for ear pain.   Eyes: Negative for pain.  Respiratory: Negative for cough.   Cardiovascular: Negative for chest pain.  Gastrointestinal: Negative for abdominal pain.  Genitourinary: Negative for flank pain.  Musculoskeletal: Negative for back pain.  Skin: Negative for rash.  Neurological: Negative for headaches.    Physical Exam Updated Vital Signs BP (!) 115/98 (BP Location: Right Arm)   Pulse 66   Temp 98.6 F (37 C) (Oral)   Resp 14   Ht 5' (1.524 m)   Wt 69.4 kg   SpO2 96%   BMI 29.88 kg/m   Physical Exam Constitutional:      General: She is not in acute distress.    Appearance: Normal appearance.  HENT:     Head: Normocephalic.     Nose: Nose normal.  Eyes:     Extraocular Movements:  Extraocular movements intact.  Cardiovascular:     Rate and Rhythm: Normal rate.  Pulmonary:     Effort: Pulmonary effort is normal.  Musculoskeletal:        General: Normal range of motion.     Cervical back: Normal range of motion.     Comments: Patient able to range her left elbow with mild to moderate pain.  She states is unclear where the maximal area of tenderness is.  Otherwise neurovascularly intact and compartments are soft.  Neurological:     General: No focal deficit present.     Mental Status: She is alert. Mental status is at baseline.     ED Results / Procedures / Treatments   Labs (all labs ordered are listed, but only abnormal results are displayed) Labs Reviewed - No data to display  EKG None  Radiology DG Elbow Complete Left  Result Date: 01/14/2021 CLINICAL DATA:  Pain.  Fall 2 days ago. EXAM: LEFT ELBOW - COMPLETE 3+ VIEW COMPARISON:  None. FINDINGS: Acute nondisplaced fracture of the radial neck. Large joint effusion. Mild degenerative changes with marginal joint osteophytes. IMPRESSION: Acute nondisplaced fracture of the radial neck with a large joint effusion. Electronically Signed   By: Feliberto Harts MD   On: 01/14/2021 13:00   DG Forearm Left  Result Date: 01/14/2021 CLINICAL DATA:  Left elbow/arm pain after a fall 2 days ago. EXAM: LEFT FOREARM - 2 VIEW COMPARISON:  None. FINDINGS: There is an elbow joint effusion. There is slight cortical irregularity of the radial head without a displaced fracture identified on this nondedicated study. The remainder of the radius and entirety of the ulna appear intact. The wrist and elbow are located. IMPRESSION: Elbow joint effusion. Dedicated elbow radiographs are recommended to further evaluate for a possible underlying radial head fracture. Electronically Signed   By: Sebastian Ache M.D.   On: 01/14/2021 12:00    Procedures .Ortho Injury Treatment  Date/Time: 01/14/2021 1:33 PM Performed by: Cheryll Cockayne,  MD Authorized by: Cheryll Cockayne, MD   Consent:    Consent obtained:  Verbal   Consent given by:  PatientPost-procedure neurovascular assessment: post-procedure neurovascularly intact Comments: Left UE sugar tong placed.      Medications Ordered in ED Medications  acetaminophen (TYLENOL) tablet 650 mg (650 mg Oral Given 01/14/21 1101)  ED Course  I have reviewed the triage vital signs and the nursing notes.  Pertinent labs & imaging results that were available during my care of the patient were reviewed by me and considered in my medical decision making (see chart for details).    MDM Rules/Calculators/A&P                          X-rays done of the left forearm left elbow.  Consistent with a left radial head fracture.  Patient placed in a sugar-tong splint, neurovascularly intact after placement, advised follow-up with orthopedic surgery within 3 to 5 days, advised immediate return for worsening pain fevers or any additional concerns.   Final Clinical Impression(s) / ED Diagnoses Final diagnoses:  Closed nondisplaced fracture of head of left radius, initial encounter    Rx / DC Orders ED Discharge Orders    None       Cheryll Cockayne, MD 01/14/21 1332    Cheryll Cockayne, MD 01/14/21 [phone]

## 2021-01-14 NOTE — ED Triage Notes (Signed)
Pt fell 2 days ago and now pain to left elbow, able to straighten out arm but with pain. Denies hitting her head.

## 2021-01-14 NOTE — Discharge Instructions (Addendum)
Call your primary care doctor or specialist as discussed in the next 2-3 days.   Return immediately back to the ER if:  Your symptoms worsen within the next 12-24 hours. You develop new symptoms such as new fevers, persistent vomiting, new pain, shortness of breath, or new weakness or numbness, or if you have any other concerns.  

## 2021-01-16 ENCOUNTER — Telehealth: Payer: Self-pay | Admitting: Orthopedic Surgery

## 2021-01-16 NOTE — Telephone Encounter (Signed)
Called back to patient to relay and to offer appointment; awaiting call back; will call back if no response.

## 2021-01-16 NOTE — Telephone Encounter (Signed)
Reached patient; scheduled accordingly; aware of appointment. °

## 2021-01-16 NOTE — Telephone Encounter (Signed)
Initial call received from patient this afternoon, 01/16/21, following emergency room visit at [facility] on 01/14/21; problem: "Acute nondisplaced fracture of the radial neck. Large joint effusion  - patient relays injury occurred on 01/12/21.  I relayed to patient that our on-call provider as well as our other providers are out of clinic this afternoon, and have offered first available clinic day, Friday, 01/18/21.  Patient then relayed that she is to drive her grandson to an appointment Friday morning.  Please advise if the next clinic day, 01/22/21, is within reasonable time frame for patient's fracture.  Patient also inquired about seeing provider Dr Romeo Apple. Please advise.

## 2021-01-22 ENCOUNTER — Other Ambulatory Visit: Payer: Self-pay

## 2021-01-22 ENCOUNTER — Ambulatory Visit: Payer: 59

## 2021-01-22 ENCOUNTER — Ambulatory Visit (INDEPENDENT_AMBULATORY_CARE_PROVIDER_SITE_OTHER): Payer: 59 | Admitting: Orthopedic Surgery

## 2021-01-22 ENCOUNTER — Encounter: Payer: Self-pay | Admitting: Orthopedic Surgery

## 2021-01-22 VITALS — BP 156/99 | HR 69 | Ht 60.0 in | Wt 153.0 lb

## 2021-01-22 DIAGNOSIS — S59902A Unspecified injury of left elbow, initial encounter: Secondary | ICD-10-CM

## 2021-01-22 DIAGNOSIS — S52135A Nondisplaced fracture of neck of left radius, initial encounter for closed fracture: Secondary | ICD-10-CM

## 2021-01-22 NOTE — Progress Notes (Signed)
New Patient Visit  Assessment: Kerri Walker is a 60 y.o. LHD female with the following: Left radial neck fracture; impacted, minimally displaced  Plan: Repeat radiographs today demonstrate an impacted left radial neck fracture.  Her pain is significantly improved.  Splint was removed in clinic.  She already has excellent range of motion.  Recommended that she continue to work on her range of motion, and limit lifting to nothing more than a coffee cup.  Provided her with a new sling, but stressed that she does not need to use the sling.  She is aware that the biggest concern for this injury is stiffness, and she will work on improving her range of motion.  Follow-up: Return in about 4 weeks (around 02/19/2021).  Subjective:  Chief Complaint  Patient presents with  . Fracture    Lt elbow DOI 01/12/21    History of Present Illness: Kerri Walker is a 60 y.o. LHD female who presents for evaluation of left elbow pain.  She states that she fell directly onto her left elbow approximately 1 week ago.  She is trying to visualize something through a window on a door, and her husband was helping lift her.  Unfortunately, her husband fell, and she fell onto her elbow.  She was evaluated in the emergency department, and placed in a splint.  She has remained in the splint.  Her pain is improved.  She has the used a sling intermittently, but is otherwise been trying to move the arm is much as possible.   Review of Systems: No fevers or chills No numbness or tingling No chest pain No shortness of breath No bowel or bladder dysfunction No GI distress No headaches   Medical History:  Past Medical History:  Diagnosis Date  . COPD (chronic obstructive pulmonary disease) (HCC)   . Hypertension     Past Surgical History:  Procedure Laterality Date  . APPENDECTOMY    . CESAREAN SECTION    . CHOLECYSTECTOMY    . TUBAL LIGATION      Family History  Problem Relation Age of Onset  .  Emphysema Paternal Grandfather   . Stroke Paternal Grandmother   . Dementia Father   . Miscarriages / [place] Daughter    Social History   Tobacco Use  . Smoking status: Current Every Day Smoker    Packs/day: 0.00    Years: 43.00    Pack years: 0.00    Types: Cigarettes  . Smokeless tobacco: Never Used  Vaping Use  . Vaping Use: Never used  Substance Use Topics  . Alcohol use: Yes    Comment: rarely  . Drug use: No    No Known Allergies  No outpatient medications have been marked as taking for the 01/22/21 encounter (Office Visit) with Oliver Barre, MD.    Objective: BP (!) 156/99   Pulse 69   Ht 5' (1.524 m)   Wt 153 lb (69.4 kg)   BMI 29.88 kg/m   Physical Exam:  General: Oriented.  No acute distress.   Gait: Normal  Evaluation of left elbow demonstrates mild swelling.  No bruising is appreciated.  She has tenderness to palpation over the radial head.  Range of motion from 30-130 with some discomfort at the extremes.  Passively she has near full pronation and supination.  Fingers are warm and well-perfused.  2+ radial pulse.  Sensation is intact distally.  She exhibit some stiffness of the left shoulder with gentle range of motion.  IMAGING: I personally ordered and reviewed the following images   X-rays of the left elbow were obtained in clinic today and demonstrates an impacted radial neck fracture, with minimal displacement.  The elbow remains reduced.  No acute injuries are noted.  Swelling has improved compared to previous x-rays.  Impression: Left radial neck fracture, minimally displaced   New Medications:  No orders of the defined types were placed in this encounter.     Oliver Barre, MD  01/22/2021 4:21 PM

## 2021-02-19 ENCOUNTER — Encounter: Payer: Self-pay | Admitting: Orthopedic Surgery

## 2021-02-19 ENCOUNTER — Ambulatory Visit: Payer: 59 | Admitting: Orthopedic Surgery

## 2021-02-19 ENCOUNTER — Ambulatory Visit: Payer: 59

## 2021-02-19 ENCOUNTER — Other Ambulatory Visit: Payer: Self-pay

## 2021-02-19 DIAGNOSIS — S52135A Nondisplaced fracture of neck of left radius, initial encounter for closed fracture: Secondary | ICD-10-CM

## 2021-02-19 DIAGNOSIS — S52135D Nondisplaced fracture of neck of left radius, subsequent encounter for closed fracture with routine healing: Secondary | ICD-10-CM | POA: Diagnosis not present

## 2021-02-19 NOTE — Progress Notes (Signed)
Orthopaedic Postop Note  Assessment: Kerri Walker is a 60 y.o. female who sustained a minimally displaced left radial neck fracture  DOI:  01/14/21  Plan: Radiographs reviewed, demonstrates interval healing.  Fracture site more apparent today, but without displacement.  She has excellent range of motion with a little bit of discomfort.  Anticipate continued gradual improvements.  No limitations on activity.  She can progressively use her left arm for more activities.  Careful with lifting heavier objects. Follow up as needed   Follow-up: Return if symptoms worsen or fail to improve. XR at next visit: Left elbow, if still having pain  Subjective:  Chief Complaint  Patient presents with   Elbow Injury    Left radial neck fracture/ has some pain with rotation/ she is left handed so has pain with activity     History of Present Illness: Kerri Walker is a 60 y.o. female who presents following the above stated injury.  She sustained a left radial neck fracture a little over a month ago.  She states her pain is improving.  She continues to have some pain with motion.  She has limited how much she uses her left arm, but it is difficult because she is left handed.  No longer using the sling.  No pain medications on a consistent basis.   Review of Systems: No fevers or chills No numbness or tingling No Chest Pain No shortness of breath   Objective: There were no vitals taken for this visit.  Physical Exam:  Alert and oriented, no acute distress.   Left elbow without swelling.  No bruising.  Full extension.  Flexion beyond 130.  Full pronation.  Near full supination with some discomfort.  Pain over the radial head.   IMAGING: I personally ordered and reviewed the following images:  XR of the left elbow demonstrates a radial neck fracture.  More apparent on XR today, compared to previous.  No interval displacement.  Elbow remains reduced.    Impression: left radial neck  fracture, acceptable alignment.   Oliver Barre, MD 02/19/2021 10:19 AM

## 2021-03-21 ENCOUNTER — Other Ambulatory Visit (HOSPITAL_COMMUNITY): Payer: Self-pay | Admitting: Adult Health

## 2021-03-21 DIAGNOSIS — Z1231 Encounter for screening mammogram for malignant neoplasm of breast: Secondary | ICD-10-CM

## 2021-03-27 ENCOUNTER — Ambulatory Visit (HOSPITAL_COMMUNITY): Payer: 59

## 2021-04-17 LAB — COLOGUARD

## 2021-04-17 LAB — HM COLONOSCOPY

## 2022-01-24 ENCOUNTER — Ambulatory Visit (HOSPITAL_COMMUNITY)
Admission: RE | Admit: 2022-01-24 | Discharge: 2022-01-24 | Disposition: A | Discharge status: Home / Self Care | Payer: 59 | Source: Ambulatory Visit | Attending: Nurse Practitioner | Admitting: Nurse Practitioner

## 2022-01-24 ENCOUNTER — Other Ambulatory Visit (HOSPITAL_COMMUNITY): Payer: Self-pay | Admitting: Nurse Practitioner

## 2022-01-24 DIAGNOSIS — M5442 Lumbago with sciatica, left side: Secondary | ICD-10-CM | POA: Diagnosis present

## 2022-01-24 DIAGNOSIS — M5441 Lumbago with sciatica, right side: Secondary | ICD-10-CM | POA: Diagnosis present

## 2022-03-17 ENCOUNTER — Ambulatory Visit (INDEPENDENT_AMBULATORY_CARE_PROVIDER_SITE_OTHER): Payer: 59 | Admitting: Physical Medicine and Rehabilitation

## 2022-03-17 ENCOUNTER — Encounter: Payer: Self-pay | Admitting: Physical Medicine and Rehabilitation

## 2022-03-17 VITALS — BP 163/84 | HR 63

## 2022-03-17 DIAGNOSIS — M5416 Radiculopathy, lumbar region: Secondary | ICD-10-CM

## 2022-03-17 DIAGNOSIS — M47816 Spondylosis without myelopathy or radiculopathy, lumbar region: Secondary | ICD-10-CM | POA: Diagnosis not present

## 2022-03-17 DIAGNOSIS — M7918 Myalgia, other site: Secondary | ICD-10-CM

## 2022-03-17 NOTE — Progress Notes (Signed)
Pt state lower back pain that travels down both legs. Pt state standing after sitting for a long time makes the pain worse. Pt state she uses heat and over the counter pain meds to help ease her pain.  Numeric Pain Rating Scale and Functional Assessment Average Pain 9 Pain Right Now 7 My pain is constant, sharp, stabbing, and aching Pain is worse with: walking, sitting, standing, and some activites Pain improves with: heat/ice and medication   In the last MONTH (on 0-10 scale) has pain interfered with the following?  1. General activity like being  able to carry out your everyday physical activities such as walking, climbing stairs, carrying groceries, or moving a chair?  Rating(5)  2. Relation with others like being able to carry out your usual social activities and roles such as  activities at home, at work and in your community. Rating(6)  3. Enjoyment of life such that you have  been bothered by emotional problems such as feeling anxious, depressed or irritable?  Rating(7)

## 2022-03-17 NOTE — Progress Notes (Signed)
Kerri Walker - 61 y.o. female MRN [id number]  Date of birth: 1961-07-06  Office Visit Note: Visit Date: 03/17/2022 PCP: Pearson Grippe, MD Referred by: Pearson Grippe, MD  Subjective: Chief Complaint  Patient presents with   Lower Back - Pain   Right Leg - Pain   Left Leg - Pain   HPI: Kerri Walker is a 61 y.o. female who comes in today per the request of Royann Shivers, NP for evaluation of chronic, worsening and severe bilateral lower back pain with intermittent radiation of pain to buttocks and bilateral posterior thighs. Patient reports most severe pain is localized to bilateral lower back back. She reports pain worsens with movement, activity and standing, describes pain as a sore and aching sensation, currently rates as 7 out of 10. Patient does report increased pain when moving from sitting to standing position. Patient reports some relief of pain with home exercise regimen, rest and use of medications. She is currently taking Flexeril as needed. Also she reports taking Gabapentin intermittently. Patients lumbar x-ray images from May exhibits gentle left convex lumbar scoliosis, multilevel facet hypertrophy, most severe at levels of L4-L5 and L5-S1. Patient has no history of lumbar injections/lumbar surgery. Patient states her pain is negatively impacting daily life and causing difficulty when completing tasks. Patient denies focal weakness, numbness and tingling. Patient denies recent trauma or falls.    Oswestry Disability Index Score 20% 0 to 10 (20%) minimal disability: The patient can cope with most living activities. Usually no treatment is indicated apart from advice on lifting sitting and exercise.  Review of Systems  Musculoskeletal:  Positive for back pain.  Neurological:  Negative for tingling, sensory change, focal weakness and weakness.  All other systems reviewed and are negative.  Otherwise per HPI.  Assessment & Plan: Visit Diagnoses:    ICD-10-CM   1. Lumbar  radiculopathy  M54.16     2. Facet arthropathy, lumbar  M47.816     3. Myofascial pain syndrome  M79.18        Plan: Findings:  Chronic, worsening and severe bilateral lower back pain with intermittent radiation of pain to buttocks and posterior thighs. Most severe pain is localized to bilateral lower back. Patient continues to have severe pain despite good conservative therapies such as home exercise regimen, rest and use of medications. Patients clinical presentation and exam are consistent with both lumbar radiculopathy and facet mediated pain. I talked with patient in detail about plan of care today. We believe the next step is to obtain lumbar MRI imaging. Patient voiced that she would like to hold on getting lumbar MRI imaging at this time as she is dealing with husbands health issues. I do feel patient would benefit from formal physical therapy and also discussed medication management with Meloxicam. Patient declined both physical therapy and Meloxicam at this time. I instructed patient to let us know if she changes her mind. No red flag symptoms noted upon exam today.     Meds & Orders: No orders of the defined types were placed in this encounter.  No orders of the defined types were placed in this encounter.   Follow-up: Return if symptoms worsen or fail to improve.   Procedures: No procedures performed      Clinical History: EXAM: LUMBAR SPINE - 2-3 VIEW   COMPARISON:  None Available.   FINDINGS: Frontal and lateral views of the lumbar spine are obtained. There are 5 non-rib-bearing lumbar type vertebral bodies with gentle left convex  curvature centered at L3. No acute fractures. There is mild spondylosis at L2-3 and L4-5. There are extensive facet hypertrophic changes throughout the lumbar spine, greatest at L4-5 and L5-S1. Sacroiliac joints are unremarkable.   IMPRESSION: 1. Multilevel spondylosis and facet hypertrophy as above, most pronounced at the L4-5 and L5-S1  levels. 2. Gentle left convex lumbar scoliosis. 3. No acute bony abnormality.     Electronically Signed   By: Sharlet Salina M.D.   On: 01/25/2022 16:07   She reports that she has been smoking cigarettes. She has never used smokeless tobacco. No results for input(s): "HGBA1C", "LABURIC" in the last 8760 hours.  Objective:  VS:  HT:    WT:   BMI:     BP:(!) 163/84  HR:63bpm  TEMP: ( )  RESP:  Physical Exam Vitals and nursing note reviewed.  HENT:     Head: Normocephalic and atraumatic.     Right Ear: External ear normal.     Left Ear: External ear normal.     Nose: Nose normal.     Mouth/Throat:     Mouth: Mucous membranes are moist.  Eyes:     Extraocular Movements: Extraocular movements intact.  Cardiovascular:     Rate and Rhythm: Normal rate.     Pulses: Normal pulses.  Pulmonary:     Effort: Pulmonary effort is normal.  Abdominal:     General: Abdomen is flat. There is no distension.  Musculoskeletal:        General: Tenderness present.     Cervical back: Normal range of motion.     Comments: Pt rises from seated position to standing without difficulty. Concordant low back pain with facet loading, lumbar spine extension and rotation. Strong distal strength without clonus, no pain upon palpation of greater trochanters. Tenderness noted upon palpation of bilateral lumbar paraspinal regions. Sensation intact bilaterally. Walks independently, gait steady.   Skin:    General: Skin is warm and dry.     Capillary Refill: Capillary refill takes less than 2 seconds.  Neurological:     General: No focal deficit present.     Mental Status: She is alert and oriented to person, place, and time.  Psychiatric:        Mood and Affect: Mood normal.        Behavior: Behavior normal.     Ortho Exam  Imaging: No results found.  Past Medical/Family/Surgical/Social History: Medications & Allergies reviewed per EMR, new medications updated. Patient Active Problem List    Diagnosis Date Noted   Special screening for malignant neoplasms, colon 12/25/2017   Encounter for gynecological examination with Papanicolaou smear of cervix 11/05/2017   Past Medical History:  Diagnosis Date   COPD (chronic obstructive pulmonary disease) (HCC)    Hypertension    Family History  Problem Relation Age of Onset   Emphysema Paternal Grandfather    Stroke Paternal Grandmother    Dementia Father    Miscarriages / [place] Daughter    Past Surgical History:  Procedure Laterality Date   APPENDECTOMY     CESAREAN SECTION     CHOLECYSTECTOMY     TUBAL LIGATION     Social History   Occupational History   Not on file  Tobacco Use   Smoking status: Every Day    Packs/day: 0.00    Years: 43.00    Total pack years: 0.00    Types: Cigarettes   Smokeless tobacco: Never  Vaping Use   Vaping Use: Never used  Substance and Sexual Activity   Alcohol use: Yes    Comment: rarely   Drug use: No   Sexual activity: Not Currently    Birth control/protection: Post-menopausal, Surgical    Comment: tubal

## 2022-05-30 ENCOUNTER — Ambulatory Visit: Payer: 59 | Admitting: Family Medicine

## 2022-06-09 ENCOUNTER — Encounter: Payer: Self-pay | Admitting: Family Medicine

## 2022-06-09 ENCOUNTER — Ambulatory Visit: Payer: 59 | Admitting: Family Medicine

## 2022-06-09 VITALS — BP 130/62 | HR 72 | Ht 60.0 in | Wt 140.0 lb

## 2022-06-09 DIAGNOSIS — J0100 Acute maxillary sinusitis, unspecified: Secondary | ICD-10-CM

## 2022-06-09 DIAGNOSIS — E038 Other specified hypothyroidism: Secondary | ICD-10-CM

## 2022-06-09 DIAGNOSIS — Z1231 Encounter for screening mammogram for malignant neoplasm of breast: Secondary | ICD-10-CM

## 2022-06-09 DIAGNOSIS — Z1159 Encounter for screening for other viral diseases: Secondary | ICD-10-CM | POA: Diagnosis not present

## 2022-06-09 DIAGNOSIS — J449 Chronic obstructive pulmonary disease, unspecified: Secondary | ICD-10-CM | POA: Diagnosis not present

## 2022-06-09 DIAGNOSIS — Z114 Encounter for screening for human immunodeficiency virus [HIV]: Secondary | ICD-10-CM

## 2022-06-09 DIAGNOSIS — G8929 Other chronic pain: Secondary | ICD-10-CM | POA: Insufficient documentation

## 2022-06-09 DIAGNOSIS — Z720 Tobacco use: Secondary | ICD-10-CM

## 2022-06-09 DIAGNOSIS — R7301 Impaired fasting glucose: Secondary | ICD-10-CM | POA: Diagnosis not present

## 2022-06-09 DIAGNOSIS — M5441 Lumbago with sciatica, right side: Secondary | ICD-10-CM

## 2022-06-09 DIAGNOSIS — E559 Vitamin D deficiency, unspecified: Secondary | ICD-10-CM

## 2022-06-09 DIAGNOSIS — R69 Illness, unspecified: Secondary | ICD-10-CM | POA: Diagnosis not present

## 2022-06-09 DIAGNOSIS — J329 Chronic sinusitis, unspecified: Secondary | ICD-10-CM | POA: Insufficient documentation

## 2022-06-09 MED ORDER — AMOXICILLIN-POT CLAVULANATE 875-125 MG PO TABS: 1.0000 | ORAL_TABLET | Freq: Two times a day (BID) | ORAL | 0 refills | Status: AC

## 2022-06-09 MED ORDER — ALBUTEROL SULFATE HFA 108 (90 BASE) MCG/ACT IN AERS: 2.0000 | INHALATION_SPRAY | RESPIRATORY_TRACT | 2 refills | Status: DC | PRN

## 2022-06-09 MED ORDER — SPIRIVA RESPIMAT 2.5 MCG/ACT IN AERS: 2.0000 | INHALATION_SPRAY | Freq: Every day | RESPIRATORY_TRACT | 2 refills | Status: DC

## 2022-06-09 MED ORDER — CYCLOBENZAPRINE HCL 5 MG PO TABS: 5.0000 mg | ORAL_TABLET | Freq: Three times a day (TID) | ORAL | 1 refills | Status: DC | PRN

## 2022-06-09 NOTE — Assessment & Plan Note (Signed)
Will be treated with Augmentin for 5 days Encouraged to continue using Flonase  nasal spray as needed Encouraged to take over-the-counter Tylenol as needed for headaches and body aches

## 2022-06-09 NOTE — Patient Instructions (Signed)
I appreciate the opportunity to provide care to you today!    Follow up:  1 months pap smear  Fasting labs: please stop by the lab today to get your blood drawn (CBC, CMP, TSH, Lipid profile, HgA1c, Vit D)  Screening: HIV and Hep C  Please pick up your prescription at the pharmacy   Please continue to a heart-healthy diet and increase your physical activities. Try to exercise for 68mins at least three times a week.      It was a pleasure to see you and I look forward to continuing to work together on your health and well-being. Please do not hesitate to call the office if you need care or have questions about your care.   Have a wonderful day and week. With Gratitude, Alvira Monday MSN, FNP-BC

## 2022-06-09 NOTE — Assessment & Plan Note (Signed)
Complains of chronic right hip pain Complains of right lumbar pain that radiates down her right leg She reports taking over-the-counter medications for pain relief Will order Flexeril to take as needed for muscle spasms Informed of the side effects of Flexeril, which includes drowsiness

## 2022-06-09 NOTE — Assessment & Plan Note (Signed)
Refilled albuterol inhaler and Spiriva inhaler Encourage patient to continue treatment regimen

## 2022-06-09 NOTE — Assessment & Plan Note (Signed)
Smokes about 1 pack/day  Asked about quitting: confirms that she currently smokes cigarettes Advise to quit smoking: Educated about QUITTING to reduce the risk of cancer, cardio and cerebrovascular disease. Assess willingness: Unwilling to quit at this time, but is working on cutting back. Assist with counseling and pharmacotherapy: Counseled for 5 minutes and literature provided. Arrange for follow up: follow up in 3 months and continue to offer help.  

## 2022-06-09 NOTE — Progress Notes (Signed)
New Patient Office Visit  Subjective:  Patient ID: Kerri Walker, female    DOB: 1960-09-24  Age: 61 y.o. MRN: [id number]  CC:  Chief Complaint  Patient presents with   Facial Pain    Sinus facial pain started on the week of 05/26/22    HPI Kerri Walker is a 61 y.o. female with past medical history of right hip pain, COPD presents for establishing care.  Tobacco use: She reports that she has been smoking since the age of 8.  She smokes 1 pack of cigarettes daily.  COPD: She uses her albuterol inhaler as needed.  She is on Spiriva and uses her inhaler daily.  She denies increased shortness of breath, chest tightness, palpitations, and chest pain.  Sinusitis: Onset of symptoms was 2 weeks ago.  She complains of cough, sneezing, nasal congestion, sinus headache, sinus pain, pressure and rhinorrhea.  She has been using her Flonase inhaler spray with some relief of symptoms.  She has also taken Advil cold and sinus  with minimal relief of symptoms.  She denies fever and chills.     Past Medical History:  Diagnosis Date   COPD (chronic obstructive pulmonary disease) ([place])    Hypertension     Past Surgical History:  Procedure Laterality Date   APPENDECTOMY     CESAREAN SECTION     CHOLECYSTECTOMY     TUBAL LIGATION      Family History  Problem Relation Age of Onset   Emphysema Paternal Grandfather    Stroke Paternal Grandmother    Dementia Father    Miscarriages / [place] Daughter     Social History   Socioeconomic History   Marital status: Married    Spouse name: Not on file   Number of children: Not on file   Years of education: Not on file   Highest education level: Not on file  Occupational History   Not on file  Tobacco Use   Smoking status: Every Day    Packs/day: 0.00    Years: 43.00    Total pack years: 0.00    Types: Cigarettes   Smokeless tobacco: Never  Vaping Use   Vaping Use: Never used  Substance and Sexual Activity   Alcohol use:  Yes    Comment: rarely   Drug use: No   Sexual activity: Not Currently    Birth control/protection: Post-menopausal, Surgical    Comment: tubal  Other Topics Concern   Not on file  Social History Narrative   Not on file   Social Determinants of Health   Financial Resource Strain: Not on file  Food Insecurity: Not on file  Transportation Needs: Not on file  Physical Activity: Not on file  Stress: Not on file  Social Connections: Not on file  Intimate Partner Violence: Not on file    ROS Review of Systems  Constitutional:  Negative for fatigue and fever.  HENT:  Positive for congestion, postnasal drip, rhinorrhea, sinus pressure, sinus pain and sneezing. Negative for sore throat.   Eyes:  Negative for visual disturbance.  Respiratory:  Positive for cough.   Cardiovascular:  Negative for chest pain and palpitations.  Gastrointestinal:  Negative for nausea and vomiting.  Endocrine: Negative for polydipsia, polyphagia and polyuria.  Genitourinary:  Negative for hematuria and urgency.  Musculoskeletal:  Negative for neck pain and neck stiffness.  Skin:  Negative for rash and wound.  Neurological:  Negative for dizziness and headaches.  Psychiatric/Behavioral:  Negative for self-injury and  suicidal ideas.     Objective:   Today's Vitals: BP 130/62 (BP Location: Right Arm, Patient Position: Sitting)   Pulse 72   Ht 5' (1.524 m)   Wt 140 lb (63.5 kg)   SpO2 93%   BMI 27.34 kg/m   Physical Exam HENT:     Head: Normocephalic.     Right Ear: External ear normal.     Left Ear: External ear normal.     Nose: No congestion.     Right Sinus: Maxillary sinus tenderness and frontal sinus tenderness present.     Left Sinus: Maxillary sinus tenderness and frontal sinus tenderness present.     Mouth/Throat:     Mouth: Mucous membranes are moist.  Eyes:     Extraocular Movements: Extraocular movements intact.     Pupils: Pupils are equal, round, and reactive to light.   Cardiovascular:     Rate and Rhythm: Normal rate and regular rhythm.     Pulses: Normal pulses.     Heart sounds: Normal heart sounds.  Pulmonary:     Effort: Pulmonary effort is normal.     Breath sounds: Normal breath sounds.  Abdominal:     Tenderness: There is no right CVA tenderness or left CVA tenderness.  Musculoskeletal:     Cervical back: No rigidity.     Right lower leg: No edema.     Left lower leg: No edema.  Skin:    Findings: No lesion.  Neurological:     Mental Status: She is alert and oriented to person, place, and time.     Assessment & Plan:   Problem List Items Addressed This Visit       Respiratory   COPD (chronic obstructive pulmonary disease) ([place]) - Primary    Refilled albuterol inhaler and Spiriva inhaler Encourage patient to continue treatment regimen      Relevant Medications   SPIRIVA RESPIMAT 2.5 MCG/ACT AERS   albuterol (VENTOLIN HFA) 108 (90 Base) MCG/ACT inhaler   Sinusitis    Will be treated with Augmentin for 5 days Encouraged to continue using Flonase  nasal spray as needed Encouraged to take over-the-counter Tylenol as needed for headaches and body aches       Relevant Medications   amoxicillin-clavulanate (AUGMENTIN) 875-125 MG tablet     Nervous and Auditory   Chronic right-sided low back pain with right-sided sciatica    Complains of chronic right hip pain Complains of right lumbar pain that radiates down her right leg She reports taking over-the-counter medications for pain relief Will order Flexeril to take as needed for muscle spasms Informed of the side effects of Flexeril, which includes drowsiness      Relevant Medications   cyclobenzaprine (FLEXERIL) 5 MG tablet     Other   Tobacco abuse    Smokes about 1pack/day  Asked about quitting: confirms that she currently smokes cigarettes Advise to quit smoking: Educated about QUITTING to reduce the risk of cancer, cardio and cerebrovascular disease. Assess  willingness: Unwilling to quit at this time, but is working on cutting back. Assist with counseling and pharmacotherapy: Counseled for 5 minutes and literature provided. Arrange for follow up: follow up in 3 months and continue to offer help.      Other Visit Diagnoses     Vitamin D deficiency       Relevant Orders   Vitamin D (25 hydroxy)   IFG (impaired fasting glucose)       Relevant Orders   CBC  with Differential/Platelet   CMP14+EGFR   Lipid Profile   Hemoglobin A1C   Other specified hypothyroidism       Relevant Orders   TSH + free T4   Need for hepatitis C screening test       Relevant Orders   Hepatitis C Antibody   Encounter for screening for HIV       Relevant Orders   HIV antibody (with reflex)   Breast cancer screening by mammogram       Relevant Orders   MM 3D SCREEN BREAST BILATERAL       Outpatient Encounter Medications as of 06/09/2022  Medication Sig   amoxicillin-clavulanate (AUGMENTIN) 875-125 MG tablet Take 1 tablet by mouth 2 (two) times daily for 5 days.   gabapentin (NEURONTIN) 100 MG capsule Take 100 mg by mouth 2 (two) times daily as needed.   hydrochlorothiazide (HYDRODIURIL) 25 MG tablet Take 25 mg by mouth daily.   losartan (COZAAR) 25 MG tablet Take 25 mg by mouth daily.   montelukast (SINGULAIR) 10 MG tablet Take 1 tablet by mouth daily.   traZODone (DESYREL) 50 MG tablet Take 50-100 mg by mouth at bedtime.   [DISCONTINUED] albuterol (VENTOLIN HFA) 108 (90 Base) MCG/ACT inhaler Inhale 2 puffs into the lungs every 4 (four) hours as needed for wheezing or shortness of breath.   [DISCONTINUED] cyclobenzaprine (FLEXERIL) 5 MG tablet Take 5 mg by mouth every 8 (eight) hours as needed.   [DISCONTINUED] SPIRIVA RESPIMAT 2.5 MCG/ACT AERS Inhale 2 puffs into the lungs daily.   albuterol (VENTOLIN HFA) 108 (90 Base) MCG/ACT inhaler Inhale 2 puffs into the lungs every 4 (four) hours as needed for wheezing or shortness of breath.   cyclobenzaprine  (FLEXERIL) 5 MG tablet Take 1 tablet (5 mg total) by mouth every 8 (eight) hours as needed.   SPIRIVA RESPIMAT 2.5 MCG/ACT AERS Inhale 2 puffs into the lungs daily.   [DISCONTINUED] predniSONE (DELTASONE) 10 MG tablet Take 6 tabs daily by mouth for 2 day,  Then 5 tabs daily for 2 days,  4 tabs daily for 2 days,  3 tabs daily for 2 days,  2 tabs daily for 2 days,  Then 1 tab daily for 2 days. (Patient not taking: Reported on 06/09/2022)   No facility-administered encounter medications on file as of 06/09/2022.    Follow-up: Return in about 1 month (around 07/10/2022) for pap smear.   Alvira Monday, FNP

## 2022-06-16 NOTE — Progress Notes (Deleted)
cologuard

## 2022-06-18 ENCOUNTER — Ambulatory Visit (HOSPITAL_COMMUNITY): Payer: 59

## 2022-06-27 DIAGNOSIS — E559 Vitamin D deficiency, unspecified: Secondary | ICD-10-CM | POA: Diagnosis not present

## 2022-06-27 DIAGNOSIS — Z114 Encounter for screening for human immunodeficiency virus [HIV]: Secondary | ICD-10-CM | POA: Diagnosis not present

## 2022-06-27 DIAGNOSIS — E038 Other specified hypothyroidism: Secondary | ICD-10-CM | POA: Diagnosis not present

## 2022-06-27 DIAGNOSIS — R7301 Impaired fasting glucose: Secondary | ICD-10-CM | POA: Diagnosis not present

## 2022-06-27 DIAGNOSIS — Z1159 Encounter for screening for other viral diseases: Secondary | ICD-10-CM | POA: Diagnosis not present

## 2022-06-27 DIAGNOSIS — R69 Illness, unspecified: Secondary | ICD-10-CM | POA: Diagnosis not present

## 2022-06-28 LAB — CBC WITH DIFFERENTIAL/PLATELET
Basophils Absolute: 0 10*3/uL (ref 0.0–0.2)
Basos: 1 %
EOS (ABSOLUTE): 0.3 10*3/uL (ref 0.0–0.4)
Eos: 4 %
Hematocrit: 45 % (ref 34.0–46.6)
Hemoglobin: 15.2 g/dL (ref 11.1–15.9)
Immature Grans (Abs): 0 10*3/uL (ref 0.0–0.1)
Immature Granulocytes: 0 %
Lymphocytes Absolute: 1.8 10*3/uL (ref 0.7–3.1)
Lymphs: 22 %
MCH: 29.4 pg (ref 26.6–33.0)
MCHC: 33.8 g/dL (ref 31.5–35.7)
MCV: 87 fL (ref 79–97)
Monocytes Absolute: 0.5 10*3/uL (ref 0.1–0.9)
Monocytes: 6 %
Neutrophils Absolute: 5.5 10*3/uL (ref 1.4–7.0)
Neutrophils: 67 %
Platelets: 277 10*3/uL (ref 150–450)
RBC: 5.17 x10E6/uL (ref 3.77–5.28)
RDW: 12.2 % (ref 11.7–15.4)
WBC: 8.2 10*3/uL (ref 3.4–10.8)

## 2022-06-28 LAB — HEPATITIS C ANTIBODY: Hep C Virus Ab: NONREACTIVE

## 2022-06-28 LAB — CMP14+EGFR
ALT: 13 IU/L (ref 0–32)
AST: 15 IU/L (ref 0–40)
Albumin/Globulin Ratio: 1.8 (ref 1.2–2.2)
Albumin: 4.4 g/dL (ref 3.8–4.9)
Alkaline Phosphatase: 97 IU/L (ref 44–121)
BUN/Creatinine Ratio: 8 — ABNORMAL LOW (ref 12–28)
BUN: 8 mg/dL (ref 8–27)
Bilirubin Total: 0.3 mg/dL (ref 0.0–1.2)
CO2: 28 mmol/L (ref 20–29)
Calcium: 9.6 mg/dL (ref 8.7–10.3)
Chloride: 99 mmol/L (ref 96–106)
Creatinine, Ser: 1.03 mg/dL — ABNORMAL HIGH (ref 0.57–1.00)
Globulin, Total: 2.5 g/dL (ref 1.5–4.5)
Glucose: 99 mg/dL (ref 70–99)
Potassium: 4 mmol/L (ref 3.5–5.2)
Sodium: 141 mmol/L (ref 134–144)
Total Protein: 6.9 g/dL (ref 6.0–8.5)
eGFR: 62 mL/min/{1.73_m2} (ref >59)

## 2022-06-28 LAB — LIPID PANEL
Chol/HDL Ratio: 3.4 ratio (ref 0.0–4.4)
Cholesterol, Total: 189 mg/dL (ref 100–199)
HDL: 55 mg/dL (ref >39)
LDL Chol Calc (NIH): 114 mg/dL — ABNORMAL HIGH (ref 0–99)
Triglycerides: 110 mg/dL (ref 0–149)
VLDL Cholesterol Cal: 20 mg/dL (ref 5–40)

## 2022-06-28 LAB — HEMOGLOBIN A1C
Est. average glucose Bld gHb Est-mCnc: 111 mg/dL
Hgb A1c MFr Bld: 5.5 % (ref 4.8–5.6)

## 2022-06-28 LAB — HIV ANTIBODY (ROUTINE TESTING W REFLEX): HIV Screen 4th Generation wRfx: NONREACTIVE

## 2022-06-28 LAB — VITAMIN D 25 HYDROXY (VIT D DEFICIENCY, FRACTURES): Vit D, 25-Hydroxy: 30.3 ng/mL (ref 30.0–100.0)

## 2022-06-28 LAB — TSH+FREE T4
Free T4: 1.14 ng/dL (ref 0.82–1.77)
TSH: 4.08 u[IU]/mL (ref 0.450–4.500)

## 2022-06-30 ENCOUNTER — Telehealth: Payer: Self-pay | Admitting: Family Medicine

## 2022-06-30 NOTE — Telephone Encounter (Signed)
Patient has changed pharmacy to [facility] refilled    traZODone (DESYREL) 50 MG tablet   gabapentin (NEURONTIN) 100 MG capsule

## 2022-07-01 ENCOUNTER — Other Ambulatory Visit: Payer: Self-pay

## 2022-07-01 ENCOUNTER — Other Ambulatory Visit: Payer: Self-pay | Admitting: Internal Medicine

## 2022-07-01 DIAGNOSIS — G4700 Insomnia, unspecified: Secondary | ICD-10-CM

## 2022-07-01 DIAGNOSIS — G8929 Other chronic pain: Secondary | ICD-10-CM

## 2022-07-01 MED ORDER — GABAPENTIN 100 MG PO CAPS: 100.0000 mg | ORAL_CAPSULE | Freq: Two times a day (BID) | ORAL | 2 refills | Status: DC

## 2022-07-01 MED ORDER — TRAZODONE HCL 50 MG PO TABS: 50.0000 mg | ORAL_TABLET | Freq: Every day | ORAL | 2 refills | Status: DC

## 2022-07-01 NOTE — Telephone Encounter (Signed)
Medications never filled by [facility], okay to refill for pt?

## 2022-07-01 NOTE — Telephone Encounter (Signed)
Patient called back she is completely out of these medications.

## 2022-07-02 ENCOUNTER — Other Ambulatory Visit: Payer: Self-pay

## 2022-07-02 ENCOUNTER — Telehealth: Payer: Self-pay | Admitting: Family Medicine

## 2022-07-02 DIAGNOSIS — G4700 Insomnia, unspecified: Secondary | ICD-10-CM

## 2022-07-02 DIAGNOSIS — G8929 Other chronic pain: Secondary | ICD-10-CM

## 2022-07-02 MED ORDER — TRAZODONE HCL 50 MG PO TABS: 50.0000 mg | ORAL_TABLET | Freq: Every day | ORAL | 2 refills | Status: DC

## 2022-07-02 MED ORDER — GABAPENTIN 100 MG PO CAPS: 100.0000 mg | ORAL_CAPSULE | Freq: Two times a day (BID) | ORAL | 2 refills | Status: DC

## 2022-07-02 NOTE — Telephone Encounter (Signed)
Medication sent to [facility].

## 2022-07-02 NOTE — Telephone Encounter (Signed)
Called [facility] and cancelled prescriptions sent yesterday.

## 2022-07-02 NOTE — Telephone Encounter (Signed)
Left vm letting pt know, medications were sent to [facility].

## 2022-07-02 NOTE — Telephone Encounter (Signed)
Pt called stating that her medication was sent to wrong phar. Can you please resend the medications that we called in yesterday to [facility]?     [facility]

## 2022-07-07 ENCOUNTER — Telehealth: Payer: Self-pay | Admitting: Physical Medicine and Rehabilitation

## 2022-07-07 ENCOUNTER — Other Ambulatory Visit: Payer: Self-pay | Admitting: Physical Medicine and Rehabilitation

## 2022-07-07 DIAGNOSIS — M5416 Radiculopathy, lumbar region: Secondary | ICD-10-CM

## 2022-07-07 MED ORDER — DIAZEPAM 5 MG PO TABS: ORAL_TABLET | ORAL | 0 refills | Status: DC

## 2022-07-07 NOTE — Telephone Encounter (Signed)
Patient called in stating she is ready to do her MRI now that she is not held up with taking care of her Husband, please advise

## 2022-07-07 NOTE — Telephone Encounter (Signed)
Spoke with patient and informed her of MRI order placed. She would like Valium just in case for the MRI. She uses [facility]

## 2022-07-09 ENCOUNTER — Other Ambulatory Visit: Payer: Self-pay | Admitting: Physical Medicine and Rehabilitation

## 2022-07-09 DIAGNOSIS — M5416 Radiculopathy, lumbar region: Secondary | ICD-10-CM

## 2022-07-11 ENCOUNTER — Ambulatory Visit (INDEPENDENT_AMBULATORY_CARE_PROVIDER_SITE_OTHER): Payer: 59 | Admitting: Family Medicine

## 2022-07-11 ENCOUNTER — Encounter: Payer: Self-pay | Admitting: Family Medicine

## 2022-07-11 ENCOUNTER — Other Ambulatory Visit (HOSPITAL_COMMUNITY)
Admission: RE | Admit: 2022-07-11 | Discharge: 2022-07-11 | Disposition: A | Discharge status: Home / Self Care | Payer: 59 | Source: Ambulatory Visit | Attending: Family Medicine | Admitting: Family Medicine

## 2022-07-11 VITALS — BP 156/98 | HR 73 | Ht 60.0 in | Wt 141.0 lb

## 2022-07-11 DIAGNOSIS — Z124 Encounter for screening for malignant neoplasm of cervix: Secondary | ICD-10-CM | POA: Diagnosis not present

## 2022-07-11 NOTE — Progress Notes (Signed)
Established Patient Office Visit  Subjective:  Patient ID: Kerri Walker, female    DOB: [birth year]  Age: 61 y.o. MRN: [id number]  CC:  Chief Complaint  Patient presents with   Follow-up    1 month f/u, pt reports sciatic flare up, back pain.    Gynecologic Exam    Pap today.    HPI Kerri Walker is a 61 y.o. female with past medical history of COPD, tobacco use, and chronic right-sided low back pain with right-sided sciatica presents for a Pap smear.  Past Medical History:  Diagnosis Date   COPD (chronic obstructive pulmonary disease) ([place])    Hypertension     Past Surgical History:  Procedure Laterality Date   APPENDECTOMY     CESAREAN SECTION     CHOLECYSTECTOMY     TUBAL LIGATION      Family History  Problem Relation Age of Onset   Emphysema Paternal Grandfather    Stroke Paternal Grandmother    Dementia Father    Miscarriages / [place] Daughter     Social History   Socioeconomic History   Marital status: Married    Spouse name: Not on file   Number of children: Not on file   Years of education: Not on file   Highest education level: Not on file  Occupational History   Not on file  Tobacco Use   Smoking status: Every Day    Packs/day: 0.00    Years: 43.00    Total pack years: 0.00    Types: Cigarettes   Smokeless tobacco: Never  Vaping Use   Vaping Use: Never used  Substance and Sexual Activity   Alcohol use: Yes    Comment: rarely   Drug use: No   Sexual activity: Not Currently    Birth control/protection: Post-menopausal, Surgical    Comment: tubal  Other Topics Concern   Not on file  Social History Narrative   Not on file   Social Determinants of Health   Financial Resource Strain: Not on file  Food Insecurity: Not on file  Transportation Needs: Not on file  Physical Activity: Not on file  Stress: Not on file  Social Connections: Not on file  Intimate Partner Violence: Not on file    Outpatient Medications Prior to  Visit  Medication Sig Dispense Refill   albuterol (VENTOLIN HFA) 108 (90 Base) MCG/ACT inhaler Inhale 2 puffs into the lungs every 4 (four) hours as needed for wheezing or shortness of breath. 6.7 g 2   cyclobenzaprine (FLEXERIL) 5 MG tablet Take 1 tablet (5 mg total) by mouth every 8 (eight) hours as needed. 30 tablet 1   diazepam (VALIUM) 5 MG tablet Take one tablet by mouth with food one hour prior to procedure. May repeat 30 minutes prior if needed. 2 tablet 0   gabapentin (NEURONTIN) 100 MG capsule Take 1 capsule (100 mg total) by mouth 2 (two) times daily. 60 capsule 2   hydrochlorothiazide (HYDRODIURIL) 25 MG tablet Take 25 mg by mouth daily.  0   losartan (COZAAR) 25 MG tablet Take 25 mg by mouth daily.  1   montelukast (SINGULAIR) 10 MG tablet Take 1 tablet by mouth daily.     SPIRIVA RESPIMAT 2.5 MCG/ACT AERS Inhale 2 puffs into the lungs daily. 4 g 2   traZODone (DESYREL) 50 MG tablet Take 1-2 tablets (50-100 mg total) by mouth at bedtime. 30 tablet 2   No facility-administered medications prior to visit.    Allergies  Allergen Reactions   Vasotec [Enalapril Maleate] Hives    ROS Review of Systems  Constitutional:  Negative for fatigue and fever.  Eyes:  Negative for visual disturbance.  Respiratory:  Negative for chest tightness and shortness of breath.   Genitourinary:  Negative for decreased urine volume, pelvic pain, urgency, vaginal bleeding, vaginal discharge and vaginal pain.  Neurological:  Negative for dizziness and headaches.      Objective:    Physical Exam HENT:     Head: Normocephalic.  Cardiovascular:     Rate and Rhythm: Normal rate and regular rhythm.     Pulses: Normal pulses.     Heart sounds: Normal heart sounds.  Pulmonary:     Effort: Pulmonary effort is normal.     Breath sounds: Normal breath sounds.  Genitourinary:    Exam position: Lithotomy position.     Tanner stage (genital): 5.     Labia:        Right: No tenderness.        Left:  No tenderness.      Vagina: No vaginal discharge.     Comments: Vaginal wall: pink and rugated, smooth and non-tender; absence of lesions, edema, and erythema. Labia Majora and Minora: present bilaterally, moist, soft tissue, and homogeneous; free of edema and ulcerations. Clitoris is anatomically present, above the urethral, and free of lesions, masses, and ulceration.    Neurological:     Mental Status: She is alert.     BP (!) 156/98   Pulse 73   Ht 5' (1.524 m)   Wt 141 lb 0.6 oz (64 kg)   SpO2 95%   BMI 27.54 kg/m  Wt Readings from Last 3 Encounters:  07/11/22 141 lb 0.6 oz (64 kg)  06/09/22 140 lb (63.5 kg)  01/22/21 153 lb (69.4 kg)    Lab Results  Component Value Date   TSH 4.080 06/27/2022   Lab Results  Component Value Date   WBC 8.2 06/27/2022   HGB 15.2 06/27/2022   HCT 45.0 06/27/2022   MCV 87 06/27/2022   PLT 277 06/27/2022   Lab Results  Component Value Date   NA 141 06/27/2022   K 4.0 06/27/2022   CO2 28 06/27/2022   GLUCOSE 99 06/27/2022   BUN 8 06/27/2022   CREATININE 1.03 (H) 06/27/2022   BILITOT 0.3 06/27/2022   ALKPHOS 97 06/27/2022   AST 15 06/27/2022   ALT 13 06/27/2022   PROT 6.9 06/27/2022   ALBUMIN 4.4 06/27/2022   CALCIUM 9.6 06/27/2022   EGFR 62 06/27/2022   Lab Results  Component Value Date   CHOL 189 06/27/2022   Lab Results  Component Value Date   HDL 55 06/27/2022   Lab Results  Component Value Date   LDLCALC 114 (H) 06/27/2022   Lab Results  Component Value Date   TRIG 110 06/27/2022   Lab Results  Component Value Date   CHOLHDL 3.4 06/27/2022   Lab Results  Component Value Date   HGBA1C 5.5 06/27/2022      Assessment & Plan:   Problem List Items Addressed This Visit       Other   Pap smear for cervical cancer screening - Primary    - According to the [employer], cytology and HPV co-testing (preferred) every 5 years or cytology alone (acceptable) every 3 years. - Pap due 2026        Other Visit Diagnoses     Cervical cancer screening  Relevant Orders   Cytology - PAP       No orders of the defined types were placed in this encounter.   Follow-up: No follow-ups on file.    Alvira Monday, FNP

## 2022-07-11 NOTE — Assessment & Plan Note (Signed)
-   According to the [employer], cytology and HPV co-testing (preferred) every 5 years or cytology alone (acceptable) every 3 years. - Pap due 2026  

## 2022-07-11 NOTE — Patient Instructions (Addendum)
I appreciate the opportunity to provide care to you today!    Follow up:  3 months  I will contact you with the result of your Pap smear on MyChart.  Please continue to a heart-healthy diet and increase your physical activities. Try to exercise for at least three times a week.      It was a pleasure to see you and I look forward to continuing to work together on your health and well-being. Please do not hesitate to call the office if you need care or have questions about your care.   Have a wonderful day and week. With Gratitude, Gilmore Laroche MSN, FNP-BC

## 2022-07-15 LAB — CYTOLOGY - PAP
Comment: NEGATIVE
Diagnosis: NEGATIVE
High risk HPV: NEGATIVE

## 2022-07-15 NOTE — Progress Notes (Signed)
Please inform the patient that her pap smear was negative for abnormal growth or malignancy on her cervix.

## 2022-07-17 ENCOUNTER — Ambulatory Visit (HOSPITAL_COMMUNITY)
Admission: RE | Admit: 2022-07-17 | Discharge: 2022-07-17 | Disposition: A | Discharge status: Home / Self Care | Payer: 59 | Source: Ambulatory Visit | Attending: Family Medicine | Admitting: Family Medicine

## 2022-07-17 DIAGNOSIS — Z1231 Encounter for screening mammogram for malignant neoplasm of breast: Secondary | ICD-10-CM | POA: Insufficient documentation

## 2022-07-26 ENCOUNTER — Ambulatory Visit
Admission: RE | Admit: 2022-07-26 | Discharge: 2022-07-26 | Disposition: A | Discharge status: Home / Self Care | Payer: 59 | Source: Ambulatory Visit | Attending: Physical Medicine and Rehabilitation | Admitting: Physical Medicine and Rehabilitation

## 2022-07-26 DIAGNOSIS — M545 Low back pain, unspecified: Secondary | ICD-10-CM | POA: Diagnosis not present

## 2022-07-26 DIAGNOSIS — M4316 Spondylolisthesis, lumbar region: Secondary | ICD-10-CM | POA: Diagnosis not present

## 2022-07-26 DIAGNOSIS — M48061 Spinal stenosis, lumbar region without neurogenic claudication: Secondary | ICD-10-CM | POA: Diagnosis not present

## 2022-07-26 DIAGNOSIS — M5416 Radiculopathy, lumbar region: Secondary | ICD-10-CM

## 2022-08-01 ENCOUNTER — Telehealth: Payer: Self-pay | Admitting: Physical Medicine and Rehabilitation

## 2022-08-01 NOTE — Telephone Encounter (Signed)
This patient has never seen Roda Shutters I'm rerouting this message to brittany since she has been seen by [employer]

## 2022-08-01 NOTE — Telephone Encounter (Signed)
Pt called in stating that she had her MRI done on 07/26/2022... Pt stated that she received her results... Pt stated that she doesn't understand the result... Pt is requesting an appt to review MRI... Pt requesting callback.Marland KitchenMarland Kitchen

## 2022-08-04 NOTE — Telephone Encounter (Signed)
Patient is scheduled for appointment on 08/05/22

## 2022-08-05 ENCOUNTER — Ambulatory Visit (INDEPENDENT_AMBULATORY_CARE_PROVIDER_SITE_OTHER): Payer: 59 | Admitting: Physical Medicine and Rehabilitation

## 2022-08-05 ENCOUNTER — Ambulatory Visit: Payer: 59 | Admitting: Orthopaedic Surgery

## 2022-08-05 ENCOUNTER — Encounter: Payer: Self-pay | Admitting: Physical Medicine and Rehabilitation

## 2022-08-05 DIAGNOSIS — M5416 Radiculopathy, lumbar region: Secondary | ICD-10-CM | POA: Diagnosis not present

## 2022-08-05 DIAGNOSIS — M47816 Spondylosis without myelopathy or radiculopathy, lumbar region: Secondary | ICD-10-CM | POA: Diagnosis not present

## 2022-08-05 DIAGNOSIS — M48062 Spinal stenosis, lumbar region with neurogenic claudication: Secondary | ICD-10-CM | POA: Diagnosis not present

## 2022-08-05 DIAGNOSIS — M4726 Other spondylosis with radiculopathy, lumbar region: Secondary | ICD-10-CM | POA: Diagnosis not present

## 2022-08-05 NOTE — Progress Notes (Unsigned)
Kerri Walker - 61 y.o. female MRN [id number]  Date of birth: July 20, 1961  Office Visit Note: Visit Date: 08/05/2022 PCP: Gilmore Laroche, FNP Referred by: Gilmore Laroche, FNP  Subjective: Chief Complaint  Patient presents with   Lower Back - Pain   HPI: Kerri Walker is a 61 y.o. female who comes in today for evaluation of chronic, worsening and severe bilateral lower back pain radiating down both lateral legs to feet.  Pain ongoing for several months and is exacerbated by prolonged standing and walking.  She describes her pain as a sharp and sore sensation, currently rates as 8 out of 10. Patient reports pain has increased to bilateral legs over the past several weeks, endorses numbness and tingling to legs with prolonged walking.  Some relief of pain with home exercise regimen, rest and use of medications.  Continues to take Flexeril and Gabapentin as needed.  Recent lumbar MRI imaging exhibits moderate to advanced facet hypertrophy and grade 1 anterolisthesis at L4-L5 contributing to moderate to severe multifactorial spinal canal stenosis. There is mild multifactorial spinal stenosis at L3-L4. Patient states she is a very active person and symptoms are negatively impacting her daily life. Patient denies focal weakness. Patient denies recent trauma or falls.     {Oswestry Disability Score:26558}  Review of Systems  Musculoskeletal:  Positive for back pain.  Neurological:  Positive for tingling. Negative for focal weakness and weakness.  All other systems reviewed and are negative.  Otherwise per HPI.  Assessment & Plan: Visit Diagnoses:    ICD-10-CM   1. Lumbar radiculopathy  M54.16     2. Other spondylosis with radiculopathy, lumbar region  M47.26     3. Spinal stenosis of lumbar region with neurogenic claudication  M48.062     4. Facet hypertrophy of lumbar region  M47.816        Plan: Findings:  Chronic, worsening and severe bilateral lower back pain radiating down  both lateral legs to feet. Patient continues to have severe pain despite good conservative therapies such as home exercise regimen, rest and use of medications. I did discuss recent lumbar MRI with patient today using imaging and spine model. Patients clinical presentation and exam are consistent with neurogenic claudication as a result of spinal canal stenosis. There is moderate to severe multifactorial spinal canal stenosis at the level of L4-L5. I did discuss lumbar epidural steroid injection and do feel she would benefit from this procedure. Patient would like to hold on injection at this time, would like time to think and talk with her family. I did explain if she gets good relief of pain with injection we are able to repeat this procedure infrequently as needed. I do feel patient would benefit from pre-procedure sedation with Valium as she voices severe anxiety related to procedures. Patient instructed to let us know if she would like to proceed with injection. I also discussed possible surgical consult with Dr. Willia Craze to discuss options, patient is not interested in surgical intervention at this time. No red flag symptoms noted upon exam today.     Meds & Orders: No orders of the defined types were placed in this encounter.  No orders of the defined types were placed in this encounter.   Follow-up: Return if symptoms worsen or fail to improve.   Procedures: No procedures performed      Clinical History: EXAM: MRI LUMBAR SPINE WITHOUT CONTRAST   TECHNIQUE: Multiplanar, multisequence MR imaging of the lumbar spine was performed.  No intravenous contrast was administered.   COMPARISON:  Lumbar spine radiographs 01/24/2022. Abdominopelvic CT 02/02/2011.   FINDINGS: Segmentation: Conventional anatomy assumed, with the last open disc space designated L5-S1.Concordant with previous imaging.   Alignment:  3 mm of degenerative anterolisthesis at L4-5.   Vertebrae: No worrisome  osseous lesion, acute fracture or pars defect. The visualized sacroiliac joints appear unremarkable.   Conus medullaris: Extends to the L1 level and appears normal.   Paraspinal and other soft tissues: No significant paraspinal findings. Mild extrahepatic biliary prominence is within physiologic limits post cholecystectomy.   Disc levels:   No significant disc space findings from T10-11 through L1-2.   L2-3: Loss of disc height with disc bulging eccentric to the right. Mild bilateral facet hypertrophy. No resulting spinal stenosis or nerve root encroachment.   L3-4: Relatively preserved disc height with mild disc bulging. Moderate bilateral facet hypertrophy with a synovial cyst medial to the right facet joint, measuring up to 5 mm on axial image 23/13. Resulting mild spinal stenosis with mild asymmetric narrowing of the right lateral recess. The foramina are sufficiently patent.   L4-5: Moderate loss of disc height with annular disc bulging. There is moderate to advanced bilateral facet hypertrophy, accounting for the grade 1 anterolisthesis. Small facet joint effusions are present bilaterally. These factors contribute to moderate to severe multifactorial spinal stenosis. There is moderate left greater than right lateral recess narrowing. Both foramina appear sufficiently patent.   L5-S1: Disc height and hydration are maintained. Mild bilateral facet hypertrophy contributes to mild narrowing of the lateral recesses and left foramen. No definite nerve root encroachment.   IMPRESSION: 1. Moderate to severe multifactorial spinal stenosis at L4-5 with moderate left greater than right lateral recess narrowing. 2. Mild multifactorial spinal stenosis at L3-4 with mild asymmetric narrowing of the right lateral recess. 3. Mild narrowing of the lateral recesses and left foramen at L5-S1.     Electronically Signed   By: Carey Bullocks M.D.   On: 07/28/2022 13:29   She reports  that she has been smoking cigarettes. She has never used smokeless tobacco.  Recent Labs    06/27/22 0807  HGBA1C 5.5    Objective:  VS:  HT:    WT:   BMI:     BP:   HR: bpm  TEMP: ( )  RESP:  Physical Exam Vitals and nursing note reviewed.  HENT:     Head: Normocephalic and atraumatic.     Right Ear: External ear normal.     Left Ear: External ear normal.     Nose: Nose normal.     Mouth/Throat:     Mouth: Mucous membranes are moist.  Eyes:     Extraocular Movements: Extraocular movements intact.  Cardiovascular:     Rate and Rhythm: Normal rate.     Pulses: Normal pulses.  Pulmonary:     Effort: Pulmonary effort is normal.  Abdominal:     General: Abdomen is flat. There is no distension.  Musculoskeletal:        General: Tenderness present.     Cervical back: Normal range of motion.     Comments: Pt rises from seated position to standing without difficulty. Good lumbar range of motion. Strong distal strength without clonus, no pain upon palpation of greater trochanters. Sensation intact bilaterally. Walks independently, gait steady.   Skin:    General: Skin is warm and dry.     Capillary Refill: Capillary refill takes less than 2 seconds.  Neurological:  General: No focal deficit present.     Mental Status: She is alert and oriented to person, place, and time.  Psychiatric:        Mood and Affect: Mood normal.        Behavior: Behavior normal.     Ortho Exam  Imaging: No results found.  Past Medical/Family/Surgical/Social History: Medications & Allergies reviewed per EMR, new medications updated. Patient Active Problem List   Diagnosis Date Noted   Pap smear for cervical cancer screening 07/11/2022   COPD (chronic obstructive pulmonary disease) (HCC) 06/09/2022   Tobacco abuse 06/09/2022   Sinusitis 06/09/2022   Chronic right-sided low back pain with right-sided sciatica 06/09/2022   Special screening for malignant neoplasms, colon 12/25/2017    Encounter for gynecological examination with Papanicolaou smear of cervix 11/05/2017   Past Medical History:  Diagnosis Date   COPD (chronic obstructive pulmonary disease) (HCC)    Hypertension    Family History  Problem Relation Age of Onset   Emphysema Paternal Grandfather    Stroke Paternal Grandmother    Dementia Father    Miscarriages / [place] Daughter    Past Surgical History:  Procedure Laterality Date   APPENDECTOMY     CESAREAN SECTION     CHOLECYSTECTOMY     TUBAL LIGATION     Social History   Occupational History   Not on file  Tobacco Use   Smoking status: Every Day    Packs/day: 0.00    Years: 43.00    Total pack years: 0.00    Types: Cigarettes   Smokeless tobacco: Never  Vaping Use   Vaping Use: Never used  Substance and Sexual Activity   Alcohol use: Yes    Comment: rarely   Drug use: No   Sexual activity: Not Currently    Birth control/protection: Post-menopausal, Surgical    Comment: tubal

## 2022-08-05 NOTE — Progress Notes (Unsigned)
Numeric Pain Rating Scale and Functional Assessment Average Pain 6   In the last MONTH (on 0-10 scale) has pain interfered with the following?  1. General activity like being  able to carry out your everyday physical activities such as walking, climbing stairs, carrying groceries, or moving a chair?  Rating( varies )   MRI review. Low back pain with radiation to both legs to bottom of the feel at times. Can have some burning and tingling feeling in legs

## 2022-08-18 ENCOUNTER — Other Ambulatory Visit: Payer: Self-pay | Admitting: Family Medicine

## 2022-08-18 DIAGNOSIS — J449 Chronic obstructive pulmonary disease, unspecified: Secondary | ICD-10-CM

## 2022-08-28 ENCOUNTER — Other Ambulatory Visit: Payer: Self-pay | Admitting: Family Medicine

## 2022-08-28 DIAGNOSIS — G8929 Other chronic pain: Secondary | ICD-10-CM

## 2022-09-04 ENCOUNTER — Other Ambulatory Visit: Payer: Self-pay | Admitting: Internal Medicine

## 2022-09-04 DIAGNOSIS — G4700 Insomnia, unspecified: Secondary | ICD-10-CM

## 2022-10-01 ENCOUNTER — Other Ambulatory Visit: Payer: Self-pay | Admitting: Family Medicine

## 2022-10-01 DIAGNOSIS — J449 Chronic obstructive pulmonary disease, unspecified: Secondary | ICD-10-CM

## 2022-10-13 ENCOUNTER — Ambulatory Visit: Payer: 59 | Admitting: Family Medicine

## 2022-10-28 ENCOUNTER — Other Ambulatory Visit: Payer: Self-pay | Admitting: Family Medicine

## 2022-10-28 DIAGNOSIS — G8929 Other chronic pain: Secondary | ICD-10-CM

## 2022-10-30 ENCOUNTER — Encounter: Payer: Self-pay | Admitting: Radiology

## 2022-11-03 ENCOUNTER — Ambulatory Visit: Payer: 59 | Admitting: Family Medicine

## 2022-11-04 ENCOUNTER — Encounter: Payer: Self-pay | Admitting: Family Medicine

## 2022-11-04 ENCOUNTER — Ambulatory Visit: Payer: 59 | Admitting: Family Medicine

## 2022-11-04 VITALS — BP 142/82 | HR 68 | Ht 60.0 in | Wt 144.1 lb

## 2022-11-04 DIAGNOSIS — E559 Vitamin D deficiency, unspecified: Secondary | ICD-10-CM | POA: Diagnosis not present

## 2022-11-04 DIAGNOSIS — M5441 Lumbago with sciatica, right side: Secondary | ICD-10-CM | POA: Diagnosis not present

## 2022-11-04 DIAGNOSIS — Z720 Tobacco use: Secondary | ICD-10-CM | POA: Diagnosis not present

## 2022-11-04 DIAGNOSIS — E0789 Other specified disorders of thyroid: Secondary | ICD-10-CM

## 2022-11-04 DIAGNOSIS — R7301 Impaired fasting glucose: Secondary | ICD-10-CM

## 2022-11-04 DIAGNOSIS — G8929 Other chronic pain: Secondary | ICD-10-CM

## 2022-11-04 DIAGNOSIS — I10 Essential (primary) hypertension: Secondary | ICD-10-CM | POA: Diagnosis not present

## 2022-11-04 DIAGNOSIS — E7849 Other hyperlipidemia: Secondary | ICD-10-CM | POA: Diagnosis not present

## 2022-11-04 MED ORDER — HYDROCHLOROTHIAZIDE 25 MG PO TABS: 25.0000 mg | ORAL_TABLET | Freq: Every day | ORAL | 1 refills | Status: DC

## 2022-11-04 MED ORDER — GABAPENTIN 100 MG PO CAPS: 100.0000 mg | ORAL_CAPSULE | Freq: Two times a day (BID) | ORAL | 2 refills | Status: DC

## 2022-11-04 NOTE — Patient Instructions (Signed)
I appreciate the opportunity to provide care to you today!    Follow up:  1 month for BP  Labs: please stop by the lab during the week  to get your blood drawn (CBC, CMP, TSH, Lipid profile, HgA1c, Vit D)  Smoking is harmful to your health and increases your risk for cancer, COPD, high blood pressure, cataracts, digestive problems, or health problems , such as gum disease, mouth sores, and tooth loss and loss of taste and smell. Smoking irritates your throat and causes coughing.    Your blood pressure is elevated today in the clinic Refill of hydrochlorothiazide 25 mg daily has been sent to your pharmacy I recommend low-sodium diet with increased physical activity Please check your blood pressure daily and bring ambulatory readings with you to next appointment I want your blood pressure to be less than 140/90    Referrals today-    Please continue to a heart-healthy diet and increase your physical activities. Try to exercise for 11mns at least five times a week.      It was a pleasure to see you and I look forward to continuing to work together on your health and well-being. Please do not hesitate to call the office if you need care or have questions about your care.   Have a wonderful day and week. With Gratitude, GAlvira MondayMSN, FNP-BC

## 2022-11-04 NOTE — Assessment & Plan Note (Addendum)
Smokes about 2 pack/day  Asked about quitting: confirms that she currently smokes cigarettes Advise to quit smoking: Educated about QUITTING to reduce the risk of cancer, cardio and cerebrovascular disease. Assess willingness: Unwilling to quit at this time, but is working on cutting back. Assist with counseling and pharmacotherapy: Counseled for 5 minutes and literature provided. Arrange for follow up: follow up in 3 months and continue to offer help.

## 2022-11-04 NOTE — Progress Notes (Unsigned)
Established Patient Office Visit  Subjective:  Patient ID: Kerri Walker, female    DOB: February 22, 1961  Age: 62 y.o. MRN: [id number]  CC:  Chief Complaint  Patient presents with   Follow-up    3 month f/u,     HPI Kerri Walker is a 62 y.o. female with past medical history of COPD, tobacco abuse, chronic right-sided low back pain with right-sided sciatica, and sinusitis presents for f/u of  chronic medical conditions. For the details of today's visit, please refer to the assessment and plan.     Past Medical History:  Diagnosis Date   COPD (chronic obstructive pulmonary disease) (HCC)    Hypertension     Past Surgical History:  Procedure Laterality Date   APPENDECTOMY     CESAREAN SECTION     CHOLECYSTECTOMY     TUBAL LIGATION      Family History  Problem Relation Age of Onset   Emphysema Paternal Grandfather    Stroke Paternal Grandmother    Dementia Father    Miscarriages / [place] Daughter     Social History   Socioeconomic History   Marital status: Married    Spouse name: Not on file   Number of children: Not on file   Years of education: Not on file   Highest education level: Not on file  Occupational History   Not on file  Tobacco Use   Smoking status: Every Day    Packs/day: 0.00    Years: 43.00    Total pack years: 0.00    Types: Cigarettes   Smokeless tobacco: Never  Vaping Use   Vaping Use: Never used  Substance and Sexual Activity   Alcohol use: Yes    Comment: rarely   Drug use: No   Sexual activity: Not Currently    Birth control/protection: Post-menopausal, Surgical    Comment: tubal  Other Topics Concern   Not on file  Social History Narrative   Not on file   Social Determinants of Health   Financial Resource Strain: Not on file  Food Insecurity: Not on file  Transportation Needs: Not on file  Physical Activity: Not on file  Stress: Not on file  Social Connections: Not on file  Intimate Partner Violence: Not on file     Outpatient Medications Prior to Visit  Medication Sig Dispense Refill   albuterol (VENTOLIN HFA) 108 (90 Base) MCG/ACT inhaler INHALE 2 PUFFS BY MOUTH EVERY 4 HOURS AS NEEDED FOR WHEEZING OR SHORTNESS OF BREATH 18 g 0   cyclobenzaprine (FLEXERIL) 5 MG tablet TAKE 1 TABLET BY MOUTH EVERY 8 HOURS AS NEEDED 30 tablet 0   hydrOXYzine (ATARAX) 10 MG tablet Take 10 mg by mouth 3 (three) times daily as needed.     losartan (COZAAR) 25 MG tablet Take 25 mg by mouth daily.  1   montelukast (SINGULAIR) 10 MG tablet Take 1 tablet by mouth daily.     SPIRIVA RESPIMAT 2.5 MCG/ACT AERS Inhale 2 puffs into the lungs daily. 4 g 2   traZODone (DESYREL) 50 MG tablet TAKE 1 TO 2 TABLETS BY MOUTH AT BEDTIME 30 tablet 0   diazepam (VALIUM) 5 MG tablet Take one tablet by mouth with food one hour prior to procedure. May repeat 30 minutes prior if needed. 2 tablet 0   gabapentin (NEURONTIN) 100 MG capsule Take 1 capsule (100 mg total) by mouth 2 (two) times daily. 60 capsule 2   hydrochlorothiazide (HYDRODIURIL) 25 MG tablet Take 25 mg  by mouth daily.  0   No facility-administered medications prior to visit.    Allergies  Allergen Reactions   Vasotec [Enalapril Maleate] Hives    ROS Review of Systems  Constitutional:  Negative for chills and fever.  Eyes:  Negative for visual disturbance.  Respiratory:  Negative for chest tightness and shortness of breath.   Neurological:  Negative for dizziness and headaches.      Objective:    Physical Exam HENT:     Head: Normocephalic.     Mouth/Throat:     Mouth: Mucous membranes are moist.  Cardiovascular:     Rate and Rhythm: Normal rate.     Heart sounds: Normal heart sounds.  Pulmonary:     Effort: Pulmonary effort is normal.     Breath sounds: Normal breath sounds.  Neurological:     Mental Status: She is alert.     BP (!) 142/82 (BP Location: Left Arm)   Pulse 68   Ht 5' (1.524 m)   Wt 144 lb 1.3 oz (65.4 kg)   SpO2 95%   BMI 28.14 kg/m   Wt Readings from Last 3 Encounters:  11/04/22 144 lb 1.3 oz (65.4 kg)  07/11/22 141 lb 0.6 oz (64 kg)  06/09/22 140 lb (63.5 kg)    Lab Results  Component Value Date   TSH 4.080 06/27/2022   Lab Results  Component Value Date   WBC 8.2 06/27/2022   HGB 15.2 06/27/2022   HCT 45.0 06/27/2022   MCV 87 06/27/2022   PLT 277 06/27/2022   Lab Results  Component Value Date   NA 141 06/27/2022   K 4.0 06/27/2022   CO2 28 06/27/2022   GLUCOSE 99 06/27/2022   BUN 8 06/27/2022   CREATININE 1.03 (H) 06/27/2022   BILITOT 0.3 06/27/2022   ALKPHOS 97 06/27/2022   AST 15 06/27/2022   ALT 13 06/27/2022   PROT 6.9 06/27/2022   ALBUMIN 4.4 06/27/2022   CALCIUM 9.6 06/27/2022   EGFR 62 06/27/2022   Lab Results  Component Value Date   CHOL 189 06/27/2022   Lab Results  Component Value Date   HDL 55 06/27/2022   Lab Results  Component Value Date   LDLCALC 114 (H) 06/27/2022   Lab Results  Component Value Date   TRIG 110 06/27/2022   Lab Results  Component Value Date   CHOLHDL 3.4 06/27/2022   Lab Results  Component Value Date   HGBA1C 5.5 06/27/2022      Assessment & Plan:  Primary hypertension Assessment & Plan: Uncontrolled Patient is asymptomatic today in the clinic She used to be on hydrochlorothiazide 25 mg a few years ago but stopped therapy Will reinstate hydrochlorothiazide 25 mg daily, and encourage patient to resume therapy Low-sodium diet with increased physical activity encouraged We will follow-up on BP in 4 weeks BP Readings from Last 3 Encounters:  11/04/22 (!) 142/82  07/11/22 (!) 156/98  06/09/22 130/62       Tobacco abuse Assessment & Plan: Smokes about 2 pack/day  Asked about quitting: confirms that she currently smokes cigarettes Advise to quit smoking: Educated about QUITTING to reduce the risk of cancer, cardio and cerebrovascular disease. Assess willingness: Unwilling to quit at this time, but is working on cutting back. Assist  with counseling and pharmacotherapy: Counseled for 5 minutes and literature provided. Arrange for follow up: follow up in 3 months and continue to offer help.     Chronic right-sided low back pain with right-sided  sciatica Assessment & Plan: Chronic condition She follow-up with orthopedics on 08/05/2022 with recommendation for steroid injections Patient declines therapy stating that her back pain is currently manageable She rates her pain her lower back 2 out of 10 in the clinic today No red flag symptoms reported Encouraged to continue conservative management  Orders: -     Gabapentin; Take 1 capsule (100 mg total) by mouth 2 (two) times daily.  Dispense: 60 capsule; Refill: 2  Vitamin D deficiency -     VITAMIN D 25 Hydroxy (Vit-D Deficiency, Fractures)  Other specified disorders of thyroid -     TSH + free T4  IFG (impaired fasting glucose)  Other hyperlipidemia -     CMP14+EGFR -     CBC with Differential/Platelet -     Lipid panel  Other orders -     hydroCHLOROthiazide; Take 1 tablet (25 mg total) by mouth daily.  Dispense: 30 tablet; Refill: 1    Follow-up: Return in about 1 month (around 12/05/2022) for BP.   Alvira Monday, FNP

## 2022-11-04 NOTE — Assessment & Plan Note (Addendum)
Uncontrolled Patient is asymptomatic today in the clinic She used to be on hydrochlorothiazide 25 mg a few years ago but stopped therapy Will reinstate hydrochlorothiazide 25 mg daily, and encourage patient to resume therapy Low-sodium diet with increased physical activity encouraged We will follow-up on BP in 4 weeks BP Readings from Last 3 Encounters:  11/04/22 (!) 142/82  07/11/22 (!) 156/98  06/09/22 130/62

## 2022-11-04 NOTE — Assessment & Plan Note (Signed)
Chronic condition She follow-up with orthopedics on 08/05/2022 with recommendation for steroid injections Patient declines therapy stating that her back pain is currently manageable She rates her pain her lower back 2 out of 10 in the clinic today No red flag symptoms reported Encouraged to continue conservative management

## 2022-11-07 ENCOUNTER — Other Ambulatory Visit: Payer: Self-pay | Admitting: Family Medicine

## 2022-11-07 DIAGNOSIS — G8929 Other chronic pain: Secondary | ICD-10-CM

## 2022-11-14 ENCOUNTER — Other Ambulatory Visit: Payer: Self-pay | Admitting: Family Medicine

## 2022-11-14 DIAGNOSIS — J449 Chronic obstructive pulmonary disease, unspecified: Secondary | ICD-10-CM

## 2022-12-05 ENCOUNTER — Ambulatory Visit: Payer: 59 | Admitting: Family Medicine

## 2022-12-08 ENCOUNTER — Other Ambulatory Visit: Payer: Self-pay | Admitting: Family Medicine

## 2022-12-08 DIAGNOSIS — J449 Chronic obstructive pulmonary disease, unspecified: Secondary | ICD-10-CM

## 2022-12-11 ENCOUNTER — Other Ambulatory Visit: Payer: Self-pay | Admitting: Family Medicine

## 2022-12-11 DIAGNOSIS — G8929 Other chronic pain: Secondary | ICD-10-CM

## 2022-12-30 ENCOUNTER — Other Ambulatory Visit: Payer: Self-pay | Admitting: Family Medicine

## 2022-12-30 DIAGNOSIS — J449 Chronic obstructive pulmonary disease, unspecified: Secondary | ICD-10-CM

## 2022-12-30 DIAGNOSIS — G4700 Insomnia, unspecified: Secondary | ICD-10-CM

## 2023-01-09 ENCOUNTER — Encounter: Payer: Self-pay | Admitting: Family Medicine

## 2023-01-09 ENCOUNTER — Ambulatory Visit: Payer: 59 | Admitting: Family Medicine

## 2023-01-09 VITALS — BP 126/72 | HR 74 | Ht 60.0 in | Wt 141.1 lb

## 2023-01-09 DIAGNOSIS — I10 Essential (primary) hypertension: Secondary | ICD-10-CM

## 2023-01-09 DIAGNOSIS — J449 Chronic obstructive pulmonary disease, unspecified: Secondary | ICD-10-CM | POA: Diagnosis not present

## 2023-01-09 DIAGNOSIS — G4700 Insomnia, unspecified: Secondary | ICD-10-CM | POA: Insufficient documentation

## 2023-01-09 MED ORDER — QUETIAPINE FUMARATE 25 MG PO TABS: 25.0000 mg | ORAL_TABLET | Freq: Every day | ORAL | 0 refills | Status: DC

## 2023-01-09 MED ORDER — HYDROCHLOROTHIAZIDE 25 MG PO TABS: 25.0000 mg | ORAL_TABLET | Freq: Every day | ORAL | 1 refills | Status: DC

## 2023-01-09 MED ORDER — SPIRIVA RESPIMAT 2.5 MCG/ACT IN AERS: 2.0000 | INHALATION_SPRAY | Freq: Every day | RESPIRATORY_TRACT | 2 refills | Status: DC

## 2023-01-09 MED ORDER — HYDROXYZINE HCL 10 MG PO TABS: 10.0000 mg | ORAL_TABLET | Freq: Three times a day (TID) | ORAL | 2 refills | Status: DC | PRN

## 2023-01-09 NOTE — Assessment & Plan Note (Addendum)
Please send to the pharmacy °

## 2023-01-09 NOTE — Assessment & Plan Note (Signed)
Controlled Patient is asymptomatic today in the clinic She takes losartan up to 25 mg daily and hydrochlorothiazide 25 mg daily Low-sodium diet with increased physical activity encouraged  BP Readings from Last 3 Encounters:  01/09/23 126/72  11/04/22 (!) 142/82  07/11/22 (!) 156/98

## 2023-01-09 NOTE — Assessment & Plan Note (Addendum)
Reports minimal relief of her symptoms with trazodone 100 mg nightly Reports getting the maximum 4 hours of sleep nightly Will discontinue therapy and start the patient on Seroquel 25 mg nightly

## 2023-01-09 NOTE — Progress Notes (Signed)
Established Patient Office Visit  Subjective:  Patient ID: Kerri Walker, female    DOB: 1961-05-15  Age: 62 y.o. MRN: [id number]  CC:  Chief Complaint  Patient presents with   Hypertension    1 month f/u for bp.     HPI Kerri Walker is a 62 y.o. female presents for blood pressure follow-up. For the details of today's visit, please refer to the assessment and plan.     Past Medical History:  Diagnosis Date   COPD (chronic obstructive pulmonary disease) (HCC)    Hypertension     Past Surgical History:  Procedure Laterality Date   APPENDECTOMY     CESAREAN SECTION     CHOLECYSTECTOMY     TUBAL LIGATION      Family History  Problem Relation Age of Onset   Emphysema Paternal Grandfather    Stroke Paternal Grandmother    Dementia Father    Miscarriages / [place] Daughter     Social History   Socioeconomic History   Marital status: Married    Spouse name: Not on file   Number of children: Not on file   Years of education: Not on file   Highest education level: Not on file  Occupational History   Not on file  Tobacco Use   Smoking status: Every Day    Packs/day: 0.00    Years: 43.00    Additional pack years: 0.00    Total pack years: 0.00    Types: Cigarettes   Smokeless tobacco: Never  Vaping Use   Vaping Use: Never used  Substance and Sexual Activity   Alcohol use: Yes    Comment: rarely   Drug use: No   Sexual activity: Not Currently    Birth control/protection: Post-menopausal, Surgical    Comment: tubal  Other Topics Concern   Not on file  Social History Narrative   Not on file   Social Determinants of Health   Financial Resource Strain: Not on file  Food Insecurity: Not on file  Transportation Needs: Not on file  Physical Activity: Not on file  Stress: Not on file  Social Connections: Not on file  Intimate Partner Violence: Not on file    Outpatient Medications Prior to Visit  Medication Sig Dispense Refill   albuterol  (VENTOLIN HFA) 108 (90 Base) MCG/ACT inhaler INHALE 2 PUFFS BY MOUTH EVERY 4 HOURS AS NEEDED FOR WHEEZING FOR SHORTNESS OF BREATH 18 g 0   cyclobenzaprine (FLEXERIL) 5 MG tablet TAKE 1 TABLET BY MOUTH EVERY 8 HOURS AS NEEDED 30 tablet 0   gabapentin (NEURONTIN) 100 MG capsule Take 1 capsule (100 mg total) by mouth 2 (two) times daily. 60 capsule 2   losartan (COZAAR) 25 MG tablet Take 25 mg by mouth daily.  1   montelukast (SINGULAIR) 10 MG tablet Take 1 tablet by mouth daily.     hydrochlorothiazide (HYDRODIURIL) 25 MG tablet Take 1 tablet (25 mg total) by mouth daily. 30 tablet 1   hydrOXYzine (ATARAX) 10 MG tablet Take 10 mg by mouth 3 (three) times daily as needed.     SPIRIVA RESPIMAT 2.5 MCG/ACT AERS Inhale 2 puffs into the lungs daily. 4 g 2   traZODone (DESYREL) 50 MG tablet TAKE 1 TO 2 TABLETS BY MOUTH AT BEDTIME AS NEEDED FOR SLEEP 180 tablet 0   No facility-administered medications prior to visit.    Allergies  Allergen Reactions   Vasotec [Enalapril Maleate] Hives    ROS Review of Systems  Constitutional:  Negative for chills and fever.  Eyes:  Negative for visual disturbance.  Respiratory:  Negative for chest tightness and shortness of breath.   Neurological:  Negative for dizziness and headaches.      Objective:    Physical Exam HENT:     Head: Normocephalic.     Mouth/Throat:     Mouth: Mucous membranes are moist.  Cardiovascular:     Rate and Rhythm: Normal rate.     Heart sounds: Normal heart sounds.  Pulmonary:     Effort: Pulmonary effort is normal.     Breath sounds: Normal breath sounds.  Neurological:     Mental Status: She is alert.     BP 126/72   Pulse 74   Ht 5' (1.524 m)   Wt 141 lb 1.3 oz (64 kg)   SpO2 97%   BMI 27.55 kg/m  Wt Readings from Last 3 Encounters:  01/09/23 141 lb 1.3 oz (64 kg)  11/04/22 144 lb 1.3 oz (65.4 kg)  07/11/22 141 lb 0.6 oz (64 kg)    Lab Results  Component Value Date   TSH 4.080 06/27/2022   Lab  Results  Component Value Date   WBC 8.2 06/27/2022   HGB 15.2 06/27/2022   HCT 45.0 06/27/2022   MCV 87 06/27/2022   PLT 277 06/27/2022   Lab Results  Component Value Date   NA 141 06/27/2022   K 4.0 06/27/2022   CO2 28 06/27/2022   GLUCOSE 99 06/27/2022   BUN 8 06/27/2022   CREATININE 1.03 (H) 06/27/2022   BILITOT 0.3 06/27/2022   ALKPHOS 97 06/27/2022   AST 15 06/27/2022   ALT 13 06/27/2022   PROT 6.9 06/27/2022   ALBUMIN 4.4 06/27/2022   CALCIUM 9.6 06/27/2022   EGFR 62 06/27/2022   Lab Results  Component Value Date   CHOL 189 06/27/2022   Lab Results  Component Value Date   HDL 55 06/27/2022   Lab Results  Component Value Date   LDLCALC 114 (H) 06/27/2022   Lab Results  Component Value Date   TRIG 110 06/27/2022   Lab Results  Component Value Date   CHOLHDL 3.4 06/27/2022   Lab Results  Component Value Date   HGBA1C 5.5 06/27/2022      Assessment & Plan:  Primary hypertension Assessment & Plan: Controlled Patient is asymptomatic today in the clinic She takes losartan up to 25 mg daily and hydrochlorothiazide 25 mg daily Low-sodium diet with increased physical activity encouraged  BP Readings from Last 3 Encounters:  01/09/23 126/72  11/04/22 (!) 142/82  07/11/22 (!) 156/98      Orders: -     hydroCHLOROthiazide; Take 1 tablet (25 mg total) by mouth daily.  Dispense: 90 tablet; Refill: 1 -     BMP8+EGFR  Insomnia, unspecified type Assessment & Plan: Reports minimal relief of her symptoms with trazodone 100 mg nightly Reports getting the maximum 4 hours of sleep nightly Will discontinue therapy and start the patient on Seroquel 25 mg nightly  Orders: -     QUEtiapine Fumarate; Take 1 tablet (25 mg total) by mouth at bedtime.  Dispense: 30 tablet; Refill: 0  Chronic obstructive pulmonary disease, unspecified COPD type (HCC) Assessment & Plan: Please send to the pharmacy  Orders: -     Spiriva Respimat; Inhale 2 puffs into the lungs  daily.  Dispense: 4 g; Refill: 2  Other orders -     hydrOXYzine HCl; Take 1 tablet (10 mg  total) by mouth 3 (three) times daily as needed.  Dispense: 90 tablet; Refill: 2    Follow-up: Return in about 3 months (around 04/11/2023).   Gilmore Laroche, FNP

## 2023-01-09 NOTE — Patient Instructions (Addendum)
I appreciate the opportunity to provide care to you today!    Follow up:  3 months  Labs: please stop by the lab today to get your blood drawn (BMP)  Please start taking seroquel 25 mg at bedtime  Please pick up your refills at the pharmacy     Please continue to a heart-healthy diet and increase your physical activities. Try to exercise for at least five days a week.      It was a pleasure to see you and I look forward to continuing to work together on your health and well-being. Please do not hesitate to call the office if you need care or have questions about your care.   Have a wonderful day and week. With Gratitude, Gilmore Laroche MSN, FNP-BC

## 2023-01-10 LAB — BMP8+EGFR
BUN/Creatinine Ratio: 8 — ABNORMAL LOW (ref 12–28)
BUN: 8 mg/dL (ref 8–27)
CO2: 25 mmol/L (ref 20–29)
Calcium: 9.7 mg/dL (ref 8.7–10.3)
Chloride: 99 mmol/L (ref 96–106)
Creatinine, Ser: 1.01 mg/dL — ABNORMAL HIGH (ref 0.57–1.00)
Glucose: 61 mg/dL — ABNORMAL LOW (ref 70–99)
Potassium: 3.6 mmol/L (ref 3.5–5.2)
Sodium: 141 mmol/L (ref 134–144)
eGFR: 63 mL/min/{1.73_m2} (ref >59)

## 2023-01-13 ENCOUNTER — Telehealth: Payer: Self-pay | Admitting: Family Medicine

## 2023-01-13 ENCOUNTER — Other Ambulatory Visit: Payer: Self-pay

## 2023-01-13 DIAGNOSIS — G8929 Other chronic pain: Secondary | ICD-10-CM

## 2023-01-13 MED ORDER — HYDROXYZINE HCL 10 MG PO TABS: 10.0000 mg | ORAL_TABLET | Freq: Three times a day (TID) | ORAL | 1 refills | Status: DC | PRN

## 2023-01-13 MED ORDER — CYCLOBENZAPRINE HCL 5 MG PO TABS: 5.0000 mg | ORAL_TABLET | Freq: Three times a day (TID) | ORAL | 0 refills | Status: DC | PRN

## 2023-01-13 MED ORDER — LOSARTAN POTASSIUM 25 MG PO TABS: 25.0000 mg | ORAL_TABLET | Freq: Every day | ORAL | 1 refills | Status: DC

## 2023-01-13 NOTE — Telephone Encounter (Signed)
Refills sent

## 2023-01-13 NOTE — Telephone Encounter (Signed)
Pt called in pharm has not received any med request from 5/10   hydrochlorothiazide  hydrOXYzine  QUEtiapine  Spiriva Respimat  Needs refill on  losartan (COZAAR) 25 MG tablet  cyclobenzaprine (FLEXERIL) 5 MG tablet [  albuterol (VENTOLIN HFA) 108 (90 Base) MCG/ACT inhaler     [facility] [address] [place] - [address], [place] [address] Phone: [phone]  Fax: [phone]

## 2023-01-15 ENCOUNTER — Other Ambulatory Visit: Payer: Self-pay | Admitting: Family Medicine

## 2023-01-15 DIAGNOSIS — J449 Chronic obstructive pulmonary disease, unspecified: Secondary | ICD-10-CM

## 2023-01-19 IMAGING — DX DG ELBOW COMPLETE 3+V*L*
4 series · 4 of 4 positions shown · non-contrast
Comparison: None.

CLINICAL DATA: Pain.  Fall 2 days ago.

EXAM:
LEFT ELBOW - COMPLETE 3+ VIEW

[elbow ap]
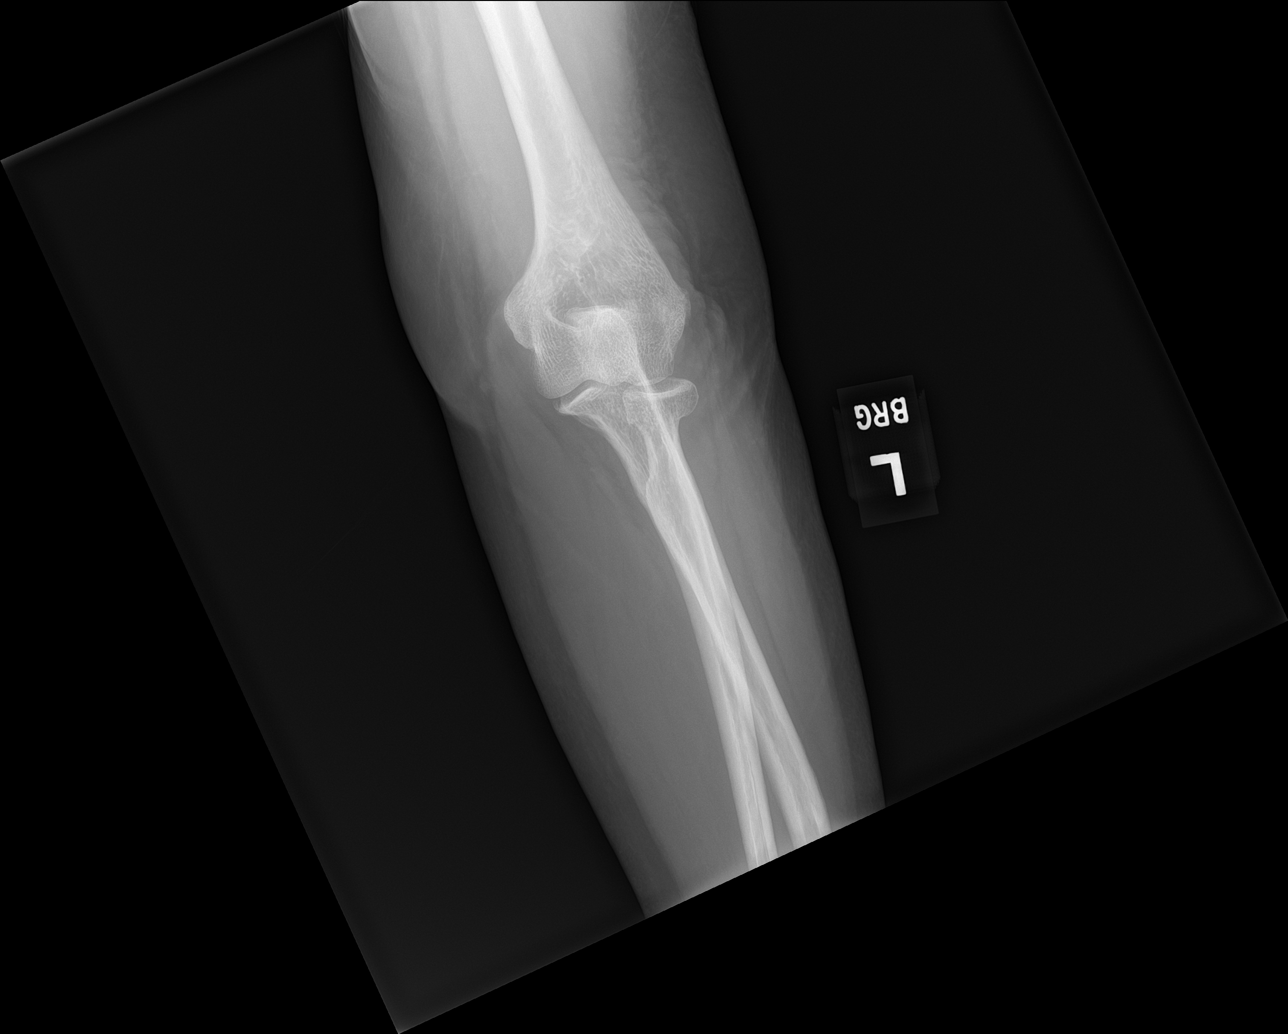

[elbow obl (1 of 2)]
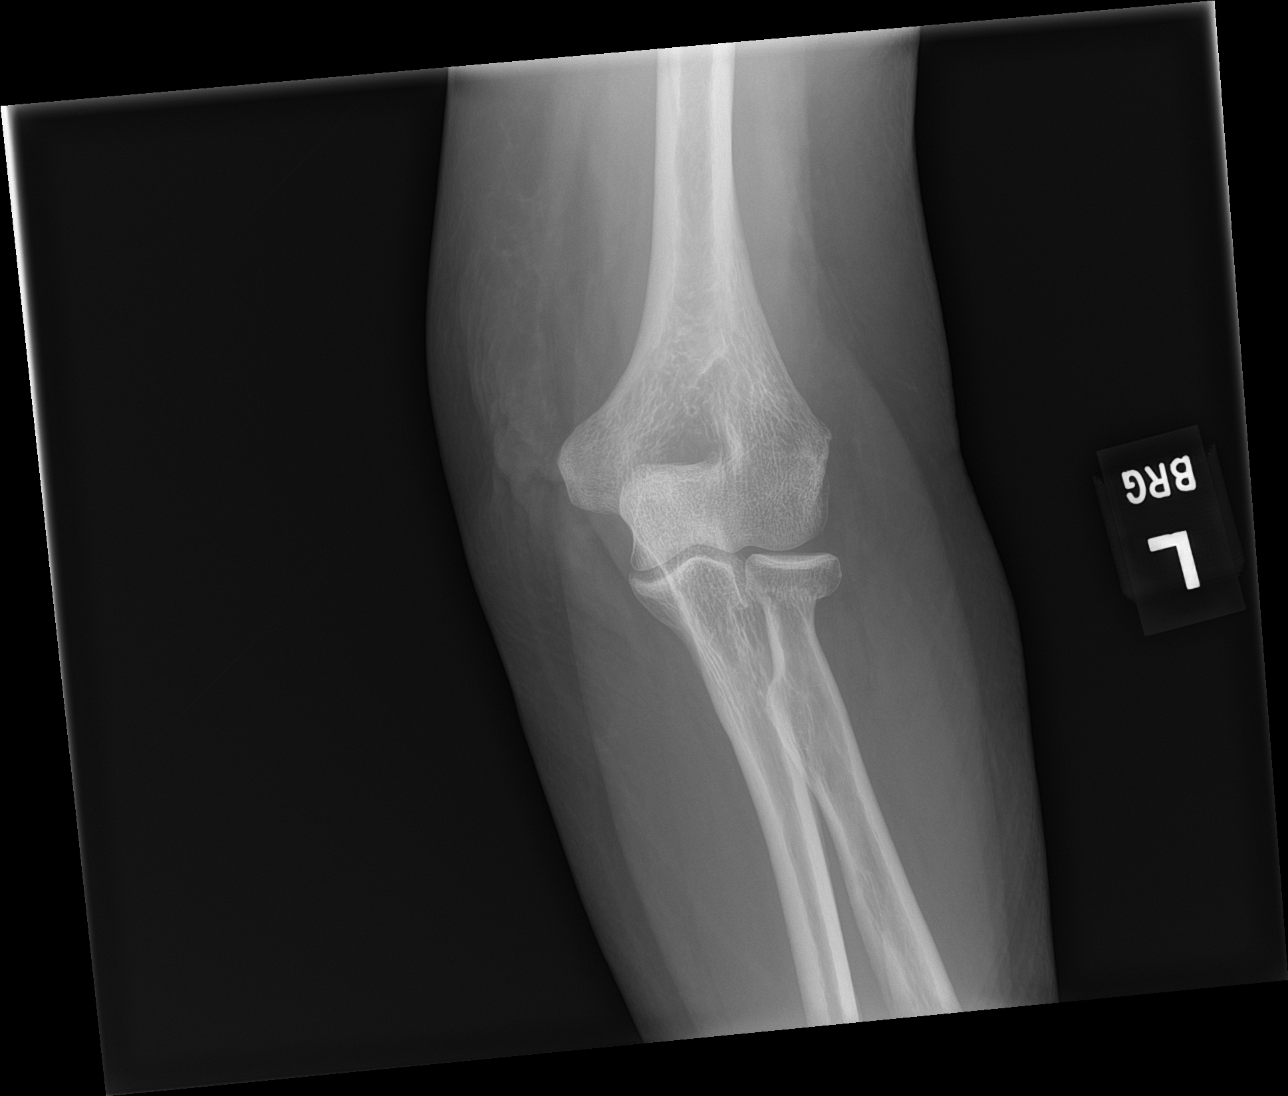

[elbow lat]
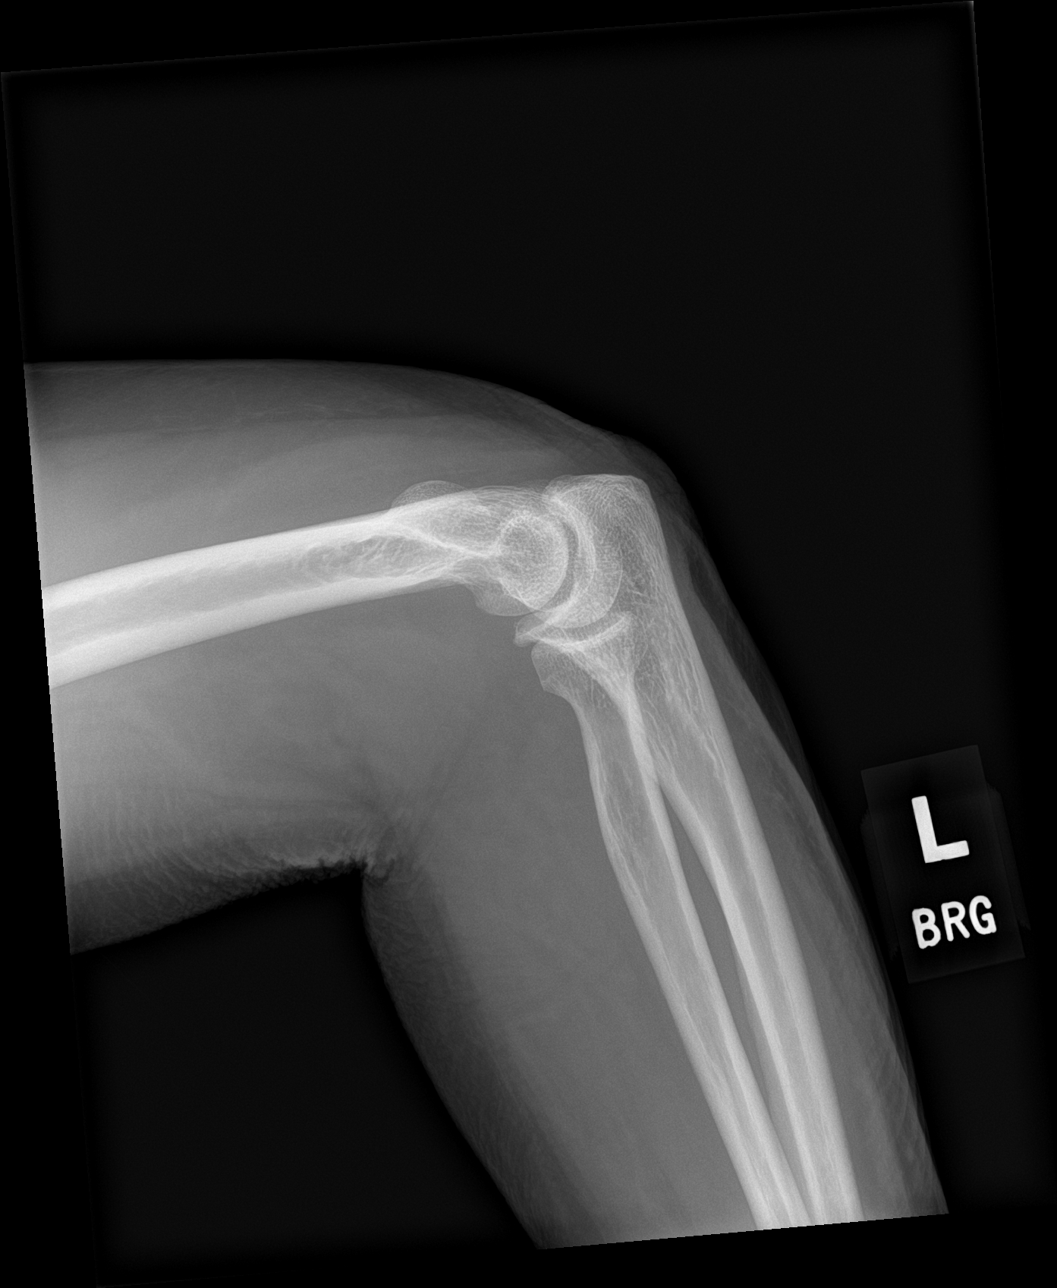

[elbow obl (2 of 2)]
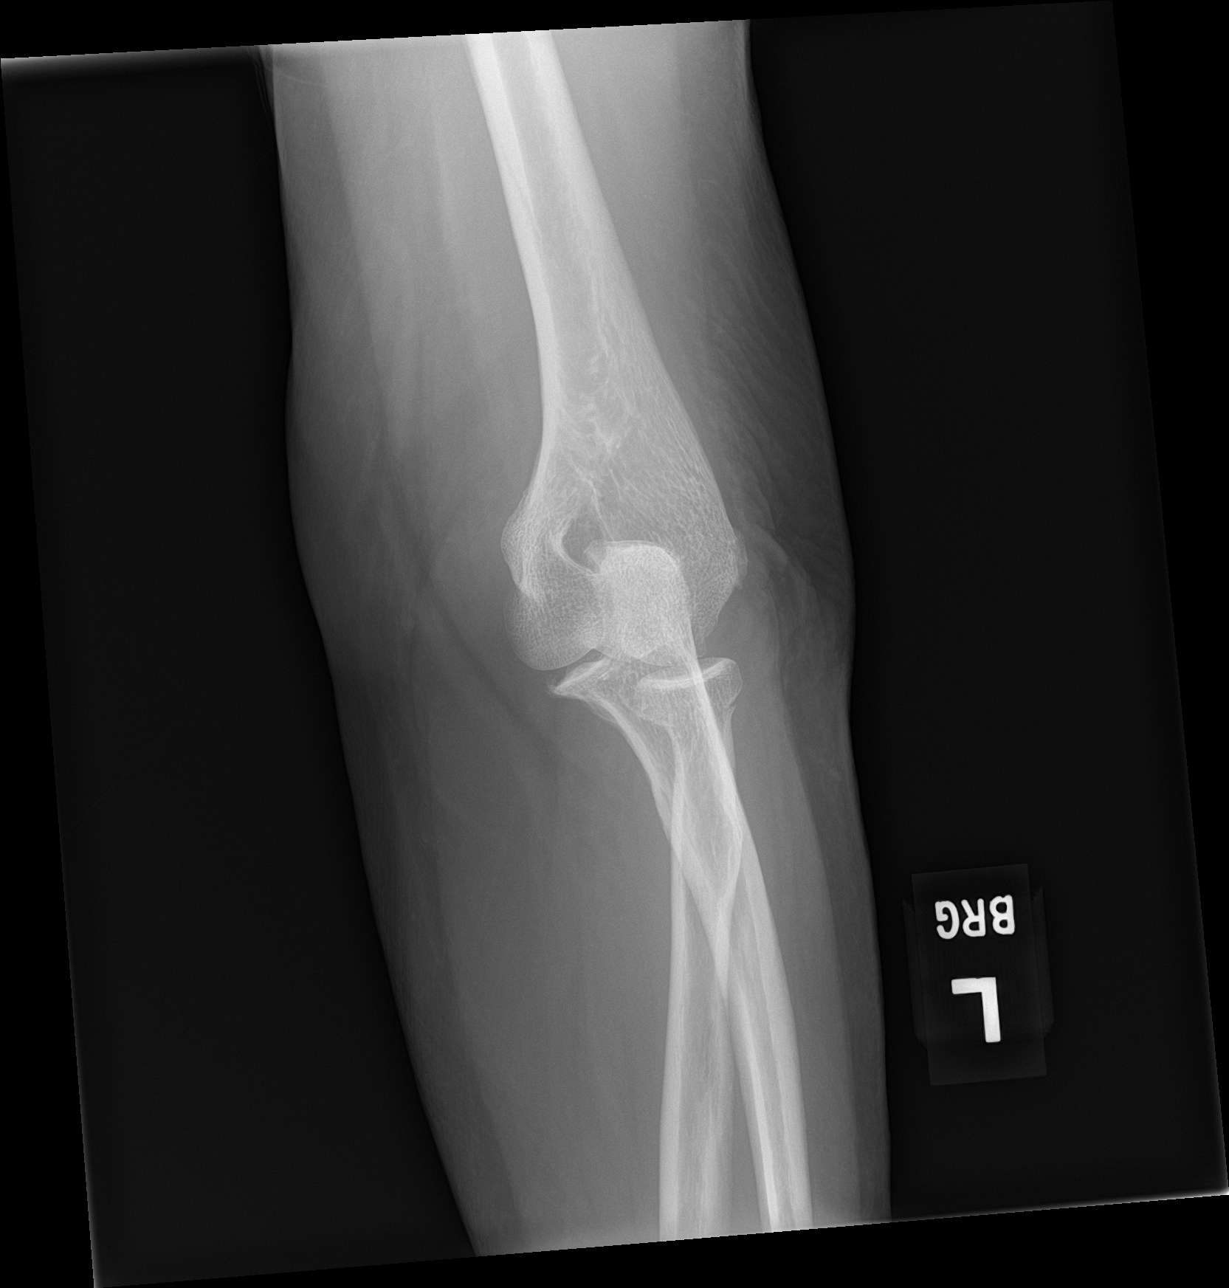

[4 of 4 positions shown; findings below may reference images not displayed]

FINDINGS: Acute nondisplaced fracture of the radial neck. Large joint
effusion. Mild degenerative changes with marginal joint osteophytes.
IMPRESSION: Acute nondisplaced fracture of the radial neck with a large joint
effusion.

## 2023-02-04 ENCOUNTER — Other Ambulatory Visit: Payer: Self-pay | Admitting: Family Medicine

## 2023-02-04 DIAGNOSIS — J449 Chronic obstructive pulmonary disease, unspecified: Secondary | ICD-10-CM

## 2023-02-25 ENCOUNTER — Other Ambulatory Visit: Payer: Self-pay | Admitting: Family Medicine

## 2023-02-25 DIAGNOSIS — J449 Chronic obstructive pulmonary disease, unspecified: Secondary | ICD-10-CM

## 2023-03-07 ENCOUNTER — Other Ambulatory Visit: Payer: Self-pay | Admitting: Family Medicine

## 2023-03-09 ENCOUNTER — Telehealth: Payer: Self-pay | Admitting: Family Medicine

## 2023-03-09 ENCOUNTER — Other Ambulatory Visit: Payer: Self-pay

## 2023-03-09 DIAGNOSIS — J0100 Acute maxillary sinusitis, unspecified: Secondary | ICD-10-CM

## 2023-03-09 MED ORDER — MONTELUKAST SODIUM 10 MG PO TABS: 10.0000 mg | ORAL_TABLET | Freq: Every day | ORAL | 1 refills | Status: DC

## 2023-03-09 NOTE — Telephone Encounter (Signed)
Refill sent.

## 2023-03-09 NOTE — Telephone Encounter (Signed)
Prescription Request  03/09/2023  LOV: 01/09/2023  What is the name of the medication or equipment? montelukast (SINGULAIR) 10 MG tablet   Have you contacted your pharmacy to request a refill? Yes   Which pharmacy would you like this sent to?   [facility] [address], [place] - [address]  [place] [address] Phone: [phone] Fax: [phone]    Patient notified that their request is being sent to the clinical staff for review and that they should receive a response within 2 business days.   Please advise at [facility] [phone]

## 2023-03-20 ENCOUNTER — Other Ambulatory Visit: Payer: Self-pay | Admitting: Family Medicine

## 2023-03-20 DIAGNOSIS — J449 Chronic obstructive pulmonary disease, unspecified: Secondary | ICD-10-CM

## 2023-03-21 ENCOUNTER — Other Ambulatory Visit: Payer: Self-pay | Admitting: Family Medicine

## 2023-03-21 DIAGNOSIS — G4700 Insomnia, unspecified: Secondary | ICD-10-CM

## 2023-03-31 ENCOUNTER — Other Ambulatory Visit: Payer: Self-pay | Admitting: Family Medicine

## 2023-03-31 DIAGNOSIS — G4700 Insomnia, unspecified: Secondary | ICD-10-CM

## 2023-04-02 ENCOUNTER — Other Ambulatory Visit: Payer: Self-pay

## 2023-04-02 ENCOUNTER — Other Ambulatory Visit: Payer: Self-pay | Admitting: Family Medicine

## 2023-04-02 ENCOUNTER — Telehealth: Payer: Self-pay | Admitting: Family Medicine

## 2023-04-02 DIAGNOSIS — G4700 Insomnia, unspecified: Secondary | ICD-10-CM

## 2023-04-02 MED ORDER — TRAZODONE HCL 50 MG PO TABS: 50.0000 mg | ORAL_TABLET | Freq: Every evening | ORAL | 0 refills | Status: DC | PRN

## 2023-04-02 NOTE — Telephone Encounter (Signed)
Patient calling says her pharmacy has tried to reach out regarding her Trazodone refill, but has not heard anything back. Please advise  Thank You

## 2023-04-02 NOTE — Telephone Encounter (Signed)
Refilled by historical medication.

## 2023-04-04 ENCOUNTER — Other Ambulatory Visit: Payer: Self-pay | Admitting: Family Medicine

## 2023-04-13 ENCOUNTER — Ambulatory Visit: Payer: 59 | Admitting: Family Medicine

## 2023-04-13 ENCOUNTER — Encounter: Payer: Self-pay | Admitting: Family Medicine

## 2023-04-13 VITALS — BP 124/82 | HR 62 | Ht 60.0 in | Wt 146.1 lb

## 2023-04-13 DIAGNOSIS — G4700 Insomnia, unspecified: Secondary | ICD-10-CM | POA: Diagnosis not present

## 2023-04-13 DIAGNOSIS — M5441 Lumbago with sciatica, right side: Secondary | ICD-10-CM

## 2023-04-13 DIAGNOSIS — E038 Other specified hypothyroidism: Secondary | ICD-10-CM | POA: Diagnosis not present

## 2023-04-13 DIAGNOSIS — R0981 Nasal congestion: Secondary | ICD-10-CM | POA: Diagnosis not present

## 2023-04-13 DIAGNOSIS — G8929 Other chronic pain: Secondary | ICD-10-CM

## 2023-04-13 DIAGNOSIS — E7849 Other hyperlipidemia: Secondary | ICD-10-CM | POA: Diagnosis not present

## 2023-04-13 DIAGNOSIS — J449 Chronic obstructive pulmonary disease, unspecified: Secondary | ICD-10-CM | POA: Diagnosis not present

## 2023-04-13 DIAGNOSIS — E559 Vitamin D deficiency, unspecified: Secondary | ICD-10-CM

## 2023-04-13 DIAGNOSIS — Z720 Tobacco use: Secondary | ICD-10-CM

## 2023-04-13 DIAGNOSIS — R7301 Impaired fasting glucose: Secondary | ICD-10-CM | POA: Diagnosis not present

## 2023-04-13 MED ORDER — QUETIAPINE FUMARATE 25 MG PO TABS: 25.0000 mg | ORAL_TABLET | Freq: Every evening | ORAL | 0 refills | Status: DC | PRN

## 2023-04-13 MED ORDER — BREZTRI AEROSPHERE 160-9-4.8 MCG/ACT IN AERO
2.0000 | INHALATION_SPRAY | Freq: Two times a day (BID) | RESPIRATORY_TRACT | 11 refills | Status: AC
Start: 2023-04-13 — End: ?

## 2023-04-13 MED ORDER — ALBUTEROL SULFATE HFA 108 (90 BASE) MCG/ACT IN AERS: 1.0000 | INHALATION_SPRAY | Freq: Four times a day (QID) | RESPIRATORY_TRACT | 2 refills | Status: DC | PRN

## 2023-04-13 MED ORDER — GABAPENTIN 100 MG PO CAPS: 100.0000 mg | ORAL_CAPSULE | Freq: Two times a day (BID) | ORAL | 2 refills | Status: DC

## 2023-04-13 MED ORDER — FLUTICASONE PROPIONATE 50 MCG/ACT NA SUSP
2.0000 | Freq: Every day | NASAL | 2 refills | Status: AC
Start: 2023-04-13 — End: ?

## 2023-04-13 NOTE — Assessment & Plan Note (Signed)
The patient continues to smoke 2 packs of cigarettes daily with no intent on quitting Smoking cessation encouraged Smoking is harmful to your health and increases your risk for cancer, COPD, high blood pressure, cataracts, digestive problems, or health problems , such as gum disease, mouth sores, and tooth loss and loss of taste and smell. Smoking irritates your throat and causes coughing.

## 2023-04-13 NOTE — Assessment & Plan Note (Signed)
Reports difficulty falling asleep She is prescribed trazodone to take 50 to 100 mg nightly which has provided relief  Review of sleep hygiene practices to implement Nonpharmacological management for insomnia Sleep hygiene practices -Establish a bedtime routine -Have a sleep and wake schedule Avoid computer other electronic devices at least 1 hour before bedtime -Never lying in bed for more than 15 minutes if not able to sleep -Reduce daily stress -Perform activities to reduce stress levels before bedtime -Avoid late in the day exercise intensive -Use bed/bedroom for sleep sex only -consider removing electronic devices away from the sleeping area before bedtime

## 2023-04-13 NOTE — Progress Notes (Signed)
Established Patient Office Visit  Subjective:  Patient ID: Kerri Walker, female    DOB: 01/03/1961  Age: 62 y.o. MRN: [id number]  CC:  Chief Complaint  Patient presents with   Care Management    3 month f/u, pt would like to discuss different option for albuterol inhaler.    Insomnia    Pt reports difficulty sleeping never picked up seroquel needs a refill if possible.     HPI Kerri Walker is a 62 y.o. female with past medical history of COPD, hypertension, tobacco use, insomnia presents for f/u of  chronic medical conditions. For the details of today's visit, please refer to the assessment and plan.     Past Medical History:  Diagnosis Date   COPD (chronic obstructive pulmonary disease) (HCC)    Hypertension     Past Surgical History:  Procedure Laterality Date   APPENDECTOMY     CESAREAN SECTION     CHOLECYSTECTOMY     TUBAL LIGATION      Family History  Problem Relation Age of Onset   Emphysema Paternal Grandfather    Stroke Paternal Grandmother    Dementia Father    Miscarriages / [place] Daughter     Social History   Socioeconomic History   Marital status: Married    Spouse name: Not on file   Number of children: Not on file   Years of education: Not on file   Highest education level: Not on file  Occupational History   Not on file  Tobacco Use   Smoking status: Every Day    Current packs/day: 0.00    Types: Cigarettes   Smokeless tobacco: Never  Vaping Use   Vaping status: Never Used  Substance and Sexual Activity   Alcohol use: Yes    Comment: rarely   Drug use: No   Sexual activity: Not Currently    Birth control/protection: Post-menopausal, Surgical    Comment: tubal  Other Topics Concern   Not on file  Social History Narrative   Not on file   Social Determinants of Health   Financial Resource Strain: Not on file  Food Insecurity: Not on file  Transportation Needs: Not on file  Physical Activity: Not on file  Stress:  Not on file  Social Connections: Not on file  Intimate Partner Violence: Not on file    Outpatient Medications Prior to Visit  Medication Sig Dispense Refill   cyclobenzaprine (FLEXERIL) 5 MG tablet Take 1 tablet (5 mg total) by mouth every 8 (eight) hours as needed. 30 tablet 0   hydrochlorothiazide (HYDRODIURIL) 25 MG tablet Take 1 tablet (25 mg total) by mouth daily. 90 tablet 1   hydrOXYzine (ATARAX) 10 MG tablet Take 1 tablet by mouth three times daily as needed 90 tablet 0   losartan (COZAAR) 25 MG tablet Take 1 tablet (25 mg total) by mouth daily. 90 tablet 1   montelukast (SINGULAIR) 10 MG tablet Take 1 tablet (10 mg total) by mouth daily. 30 tablet 1   traZODone (DESYREL) 50 MG tablet Take 1-2 tablets (50-100 mg total) by mouth at bedtime as needed. for sleep 180 tablet 0   gabapentin (NEURONTIN) 100 MG capsule Take 1 capsule (100 mg total) by mouth 2 (two) times daily. 60 capsule 2   QUEtiapine (SEROQUEL) 25 MG tablet Take 1 tablet (25 mg total) by mouth at bedtime. 30 tablet 0   SPIRIVA RESPIMAT 2.5 MCG/ACT AERS Inhale 2 puffs into the lungs daily. 4 g 2  albuterol (VENTOLIN HFA) 108 (90 Base) MCG/ACT inhaler INHALE 2 PUFFS BY MOUTH EVERY 4 HOURS AS NEEDED FOR WHEEZING OR SHORTNESS OF BREATH (Patient not taking: Reported on 04/13/2023) 18 g 0   No facility-administered medications prior to visit.    Allergies  Allergen Reactions   Vasotec [Enalapril Maleate] Hives    ROS Review of Systems  Constitutional:  Negative for chills and fever.  Eyes:  Negative for visual disturbance.  Respiratory:  Negative for chest tightness and shortness of breath.   Neurological:  Negative for dizziness and headaches.      Objective:    Physical Exam HENT:     Head: Normocephalic.     Mouth/Throat:     Mouth: Mucous membranes are moist.  Cardiovascular:     Rate and Rhythm: Normal rate.     Heart sounds: Normal heart sounds.  Pulmonary:     Effort: Pulmonary effort is normal.      Breath sounds: Normal breath sounds.  Neurological:     Mental Status: She is alert.     BP 124/82   Pulse 62   Ht 5' (1.524 m)   Wt 146 lb 1.3 oz (66.3 kg)   SpO2 94%   BMI 28.53 kg/m  Wt Readings from Last 3 Encounters:  04/13/23 146 lb 1.3 oz (66.3 kg)  01/09/23 141 lb 1.3 oz (64 kg)  11/04/22 144 lb 1.3 oz (65.4 kg)    Lab Results  Component Value Date   TSH 4.080 06/27/2022   Lab Results  Component Value Date   WBC 8.2 06/27/2022   HGB 15.2 06/27/2022   HCT 45.0 06/27/2022   MCV 87 06/27/2022   PLT 277 06/27/2022   Lab Results  Component Value Date   NA 141 01/09/2023   K 3.6 01/09/2023   CO2 25 01/09/2023   GLUCOSE 61 (L) 01/09/2023   BUN 8 01/09/2023   CREATININE 1.01 (H) 01/09/2023   BILITOT 0.3 06/27/2022   ALKPHOS 97 06/27/2022   AST 15 06/27/2022   ALT 13 06/27/2022   PROT 6.9 06/27/2022   ALBUMIN 4.4 06/27/2022   CALCIUM 9.7 01/09/2023   EGFR 63 01/09/2023   Lab Results  Component Value Date   CHOL 189 06/27/2022   Lab Results  Component Value Date   HDL 55 06/27/2022   Lab Results  Component Value Date   LDLCALC 114 (H) 06/27/2022   Lab Results  Component Value Date   TRIG 110 06/27/2022   Lab Results  Component Value Date   CHOLHDL 3.4 06/27/2022   Lab Results  Component Value Date   HGBA1C 5.5 06/27/2022      Assessment & Plan:  Chronic obstructive pulmonary disease, unspecified COPD type (HCC) Assessment & Plan: Reports frequent use of her albuterol inhaler more than 3 times weekly She is currently on maintenance inhaler with Spiriva Will discontinue therapy today We will provide the patient with Breztri inhaler for maintenance therapy Prescription sent to the pharmacy No audible wheezing auscultated  Orders: -     Breztri Aerosphere; Inhale 2 puffs into the lungs 2 (two) times daily.  Dispense: 10.7 g; Refill: 11 -     Albuterol Sulfate HFA; Inhale 1-2 puffs into the lungs every 6 (six) hours as needed for  wheezing or shortness of breath.  Dispense: 18 g; Refill: 2  Insomnia, unspecified type Assessment & Plan: Reports difficulty falling asleep She is prescribed trazodone to take 50 to 100 mg nightly which has provided relief  Review of sleep hygiene practices to implement Nonpharmacological management for insomnia Sleep hygiene practices -Establish a bedtime routine -Have a sleep and wake schedule Avoid computer other electronic devices at least 1 hour before bedtime -Never lying in bed for more than 15 minutes if not able to sleep -Reduce daily stress -Perform activities to reduce stress levels before bedtime -Avoid late in the day exercise intensive -Use bed/bedroom for sleep sex only -consider removing electronic devices away from the sleeping area before bedtime    Tobacco abuse Assessment & Plan: The patient continues to smoke 2 packs of cigarettes daily with no intent on quitting Smoking cessation encouraged Smoking is harmful to your health and increases your risk for cancer, COPD, high blood pressure, cataracts, digestive problems, or health problems , such as gum disease, mouth sores, and tooth loss and loss of taste and smell. Smoking irritates your throat and causes coughing.    Nasal congestion -     Fluticasone Propionate; Place 2 sprays into both nostrils daily.  Dispense: 16 g; Refill: 2  IFG (impaired fasting glucose) -     Hemoglobin A1c  Vitamin D deficiency -     VITAMIN D 25 Hydroxy (Vit-D Deficiency, Fractures)  Other specified hypothyroidism -     TSH + free T4  Other hyperlipidemia -     Lipid panel -     CMP14+EGFR -     CBC with Differential/Platelet  Chronic right-sided low back pain with right-sided sciatica -     Gabapentin; Take 1 capsule (100 mg total) by mouth 2 (two) times daily.  Dispense: 60 capsule; Refill: 2  Note: This chart has been completed using Engineer, civil (consulting) software, and while attempts have been made to ensure  accuracy, certain words and phrases may not be transcribed as intended.    Follow-up: Return in about 1 month (around 05/14/2023).   Gilmore Laroche, FNP

## 2023-04-13 NOTE — Assessment & Plan Note (Signed)
Reports frequent use of her albuterol inhaler more than 3 times weekly She is currently on maintenance inhaler with Spiriva Will discontinue therapy today We will provide the patient with Breztri inhaler for maintenance therapy Prescription sent to the pharmacy No audible wheezing auscultated

## 2023-04-13 NOTE — Patient Instructions (Addendum)
I appreciate the opportunity to provide care to you today!    Follow up:  1 month  Labs: please stop by the lab today to get your blood drawn (CBC, CMP, TSH, Lipid profile, HgA1c, Vit D)  Nonpharmacological management for insomnia Sleep hygiene practices -Establish a bedtime routine -Have a sleep and wake schedule -Avoid computer other electronic devices at least 1 hour before bedtime -Never lying in bed for more than 15 minutes if not able to sleep -Reduce daily stress -Perform activities to reduce stress levels before bedtime -Avoid late in the day exercise intensive -Use bed/bedroom for sleep sex only -consider removing electronic devices away from the sleeping area before bedtime    Attached with your AVS, you will find valuable resources for self-education. I highly recommend dedicating some time to thoroughly examine them.   Please continue to a heart-healthy diet and increase your physical activities. Try to exercise for at least five days a week.    It was a pleasure to see you and I look forward to continuing to work together on your health and well-being. Please do not hesitate to call the office if you need care or have questions about your care.  In case of emergency, please visit the Emergency Department for urgent care, or contact our clinic at [phone] to schedule an appointment. We're here to help you!   Have a wonderful day and week. With Gratitude, Gilmore Laroche MSN, FNP-BC

## 2023-04-20 ENCOUNTER — Other Ambulatory Visit: Payer: Self-pay | Admitting: Family Medicine

## 2023-04-20 DIAGNOSIS — E7849 Other hyperlipidemia: Secondary | ICD-10-CM

## 2023-04-20 MED ORDER — ATORVASTATIN CALCIUM 10 MG PO TABS
10.0000 mg | ORAL_TABLET | Freq: Every day | ORAL | 1 refills | Status: DC
Start: 2023-04-20 — End: 2024-03-03

## 2023-04-20 NOTE — Progress Notes (Signed)
The 10-year ASCVD risk score (Arnett DK, et al., 2019) is: 9.1%   Values used to calculate the score:     Age: 62 years     Sex: Female     Is Non-Hispanic African American: No     Diabetic: No     Tobacco smoker: Yes     Systolic Blood Pressure: 124 mmHg     Is BP treated: Yes     HDL Cholesterol: 61 mg/dL     Total Cholesterol: 210 mg/dL

## 2023-05-05 ENCOUNTER — Other Ambulatory Visit: Payer: Self-pay | Admitting: Family Medicine

## 2023-05-05 DIAGNOSIS — J0100 Acute maxillary sinusitis, unspecified: Secondary | ICD-10-CM

## 2023-05-18 ENCOUNTER — Ambulatory Visit: Payer: 59 | Admitting: Family Medicine

## 2023-05-27 ENCOUNTER — Other Ambulatory Visit: Payer: Self-pay | Admitting: Family Medicine

## 2023-06-02 ENCOUNTER — Other Ambulatory Visit: Payer: Self-pay | Admitting: Family Medicine

## 2023-06-02 DIAGNOSIS — G8929 Other chronic pain: Secondary | ICD-10-CM

## 2023-06-03 ENCOUNTER — Other Ambulatory Visit: Payer: Self-pay | Admitting: Family Medicine

## 2023-06-03 DIAGNOSIS — J0100 Acute maxillary sinusitis, unspecified: Secondary | ICD-10-CM

## 2023-06-04 ENCOUNTER — Ambulatory Visit: Payer: 59 | Admitting: Family Medicine

## 2023-06-04 ENCOUNTER — Encounter: Payer: Self-pay | Admitting: Family Medicine

## 2023-06-04 DIAGNOSIS — J449 Chronic obstructive pulmonary disease, unspecified: Secondary | ICD-10-CM

## 2023-06-04 MED ORDER — BREZTRI AEROSPHERE 160-9-4.8 MCG/ACT IN AERO
2.0000 | INHALATION_SPRAY | Freq: Two times a day (BID) | RESPIRATORY_TRACT | 11 refills | Status: DC
Start: 2023-06-04 — End: 2023-06-05

## 2023-06-04 NOTE — Patient Instructions (Addendum)
I appreciate the opportunity to provide care to you today!    Follow up:  4 months   Attached with your AVS, you will find valuable resources for self-education. I highly recommend dedicating some time to thoroughly examine them.   Please continue to a heart-healthy diet and increase your physical activities. Try to exercise for at least five days a week.    It was a pleasure to see you and I look forward to continuing to work together on your health and well-being. Please do not hesitate to call the office if you need care or have questions about your care.  In case of emergency, please visit the Emergency Department for urgent care, or contact our clinic at [phone] to schedule an appointment. We're here to help you!   Have a wonderful day and week. With Gratitude, Gilmore Laroche MSN, FNP-BC

## 2023-06-04 NOTE — Progress Notes (Signed)
Established Patient Office Visit  Subjective:  Patient ID: Kerri Walker, female    DOB: Mar 28, 1961  Age: 62 y.o. MRN: [id number]  CC:  Chief Complaint  Patient presents with   Care Management    1 month f/u    HPI Kerri Walker is a 62 y.o. female with past medical history of COPD presents for f/u. For the details of today's visit, please refer to the assessment and plan.     Past Medical History:  Diagnosis Date   COPD (chronic obstructive pulmonary disease) (HCC)    Hypertension     Past Surgical History:  Procedure Laterality Date   APPENDECTOMY     CESAREAN SECTION     CHOLECYSTECTOMY     TUBAL LIGATION      Family History  Problem Relation Age of Onset   Emphysema Paternal Grandfather    Stroke Paternal Grandmother    Dementia Father    Miscarriages / [place] Daughter     Social History   Socioeconomic History   Marital status: Married    Spouse name: Not on file   Number of children: Not on file   Years of education: Not on file   Highest education level: Not on file  Occupational History   Not on file  Tobacco Use   Smoking status: Every Day    Current packs/day: 0.00    Types: Cigarettes   Smokeless tobacco: Never  Vaping Use   Vaping status: Never Used  Substance and Sexual Activity   Alcohol use: Yes    Comment: rarely   Drug use: No   Sexual activity: Not Currently    Birth control/protection: Post-menopausal, Surgical    Comment: tubal  Other Topics Concern   Not on file  Social History Narrative   Not on file   Social Determinants of Health   Financial Resource Strain: Not on file  Food Insecurity: Not on file  Transportation Needs: Not on file  Physical Activity: Not on file  Stress: Not on file  Social Connections: Not on file  Intimate Partner Violence: Not on file    Outpatient Medications Prior to Visit  Medication Sig Dispense Refill   atorvastatin (LIPITOR) 10 MG tablet Take 1 tablet (10 mg total) by  mouth daily. 90 tablet 1   cyclobenzaprine (FLEXERIL) 5 MG tablet TAKE 1 TABLET BY MOUTH EVERY 8 HOURS AS NEEDED 30 tablet 0   fluticasone (FLONASE) 50 MCG/ACT nasal spray Place 2 sprays into both nostrils daily. 16 g 2   gabapentin (NEURONTIN) 100 MG capsule Take 1 capsule (100 mg total) by mouth 2 (two) times daily. 60 capsule 2   hydrochlorothiazide (HYDRODIURIL) 25 MG tablet Take 1 tablet (25 mg total) by mouth daily. 90 tablet 1   hydrOXYzine (ATARAX) 10 MG tablet Take 1 tablet by mouth three times daily as needed 90 tablet 0   montelukast (SINGULAIR) 10 MG tablet Take 1 tablet by mouth once daily 30 tablet 0   traZODone (DESYREL) 50 MG tablet Take 1-2 tablets (50-100 mg total) by mouth at bedtime as needed. for sleep 180 tablet 0   Budeson-Glycopyrrol-Formoterol (BREZTRI AEROSPHERE) 160-9-4.8 MCG/ACT AERO Inhale 2 puffs into the lungs 2 (two) times daily. 10.7 g 11   albuterol (VENTOLIN HFA) 108 (90 Base) MCG/ACT inhaler Inhale 1-2 puffs into the lungs every 6 (six) hours as needed for wheezing or shortness of breath. 18 g 2   losartan (COZAAR) 25 MG tablet Take 1 tablet (25 mg total)  by mouth daily. 90 tablet 1   No facility-administered medications prior to visit.    Allergies  Allergen Reactions   Vasotec [Enalapril Maleate] Hives    ROS Review of Systems  Constitutional:  Negative for chills and fever.  Eyes:  Negative for visual disturbance.  Respiratory:  Negative for chest tightness and shortness of breath.   Neurological:  Negative for dizziness and headaches.      Objective:    Physical Exam HENT:     Head: Normocephalic.     Mouth/Throat:     Mouth: Mucous membranes are moist.  Cardiovascular:     Rate and Rhythm: Normal rate.     Heart sounds: Normal heart sounds.  Pulmonary:     Effort: Pulmonary effort is normal.     Breath sounds: Normal breath sounds.  Neurological:     Mental Status: She is alert.     BP 128/82   Pulse 73   Ht 5' (1.524 m)   Wt  150 lb 1.3 oz (68.1 kg)   SpO2 96%   BMI 29.31 kg/m  Wt Readings from Last 3 Encounters:  06/04/23 150 lb 1.3 oz (68.1 kg)  04/13/23 146 lb 1.3 oz (66.3 kg)  01/09/23 141 lb 1.3 oz (64 kg)    Lab Results  Component Value Date   TSH 3.180 04/13/2023   Lab Results  Component Value Date   WBC 9.3 04/13/2023   HGB 14.5 04/13/2023   HCT 43.6 04/13/2023   MCV 88 04/13/2023   PLT 280 04/13/2023   Lab Results  Component Value Date   NA 143 04/13/2023   K 4.9 04/13/2023   CO2 27 04/13/2023   GLUCOSE 81 04/13/2023   BUN 13 04/13/2023   CREATININE 1.01 (H) 04/13/2023   BILITOT 0.2 04/13/2023   ALKPHOS 101 04/13/2023   AST 19 04/13/2023   ALT 16 04/13/2023   PROT 6.7 04/13/2023   ALBUMIN 4.5 04/13/2023   CALCIUM 9.3 04/13/2023   EGFR 63 04/13/2023   Lab Results  Component Value Date   CHOL 210 (H) 04/13/2023   Lab Results  Component Value Date   HDL 61 04/13/2023   Lab Results  Component Value Date   LDLCALC 131 (H) 04/13/2023   Lab Results  Component Value Date   TRIG 103 04/13/2023   Lab Results  Component Value Date   CHOLHDL 3.4 04/13/2023   Lab Results  Component Value Date   HGBA1C 5.8 (H) 04/13/2023      Assessment & Plan:  Chronic obstructive pulmonary disease, unspecified COPD type (HCC) Assessment & Plan: The patient reports doing well on Breztri; however, her insurance prefers Trelegy. A prescription for Trelegy will be sent to her pharmacy today.    Note: This chart has been completed using Engineer, civil (consulting) software, and while attempts have been made to ensure accuracy, certain words and phrases may not be transcribed as intended.    Follow-up: Return in about 4 months (around 10/05/2023).   Gilmore Laroche, FNP

## 2023-06-05 ENCOUNTER — Other Ambulatory Visit: Payer: Self-pay | Admitting: Family Medicine

## 2023-06-05 ENCOUNTER — Telehealth: Payer: Self-pay

## 2023-06-05 DIAGNOSIS — J449 Chronic obstructive pulmonary disease, unspecified: Secondary | ICD-10-CM

## 2023-06-05 MED ORDER — TRELEGY ELLIPTA 100-62.5-25 MCG/ACT IN AEPB
1.0000 | INHALATION_SPRAY | Freq: Every day | RESPIRATORY_TRACT | 11 refills | Status: DC
Start: 2023-06-05 — End: 2023-10-05

## 2023-06-05 NOTE — Telephone Encounter (Signed)
Please advice  

## 2023-06-05 NOTE — Telephone Encounter (Signed)
Rx sent 

## 2023-06-06 NOTE — Assessment & Plan Note (Signed)
The patient reports doing well on Breztri; however, her insurance prefers Trelegy. A prescription for Trelegy will be sent to her pharmacy today.

## 2023-06-24 ENCOUNTER — Other Ambulatory Visit: Payer: Self-pay | Admitting: Family Medicine

## 2023-06-28 ENCOUNTER — Other Ambulatory Visit: Payer: Self-pay | Admitting: Family Medicine

## 2023-06-28 DIAGNOSIS — G4700 Insomnia, unspecified: Secondary | ICD-10-CM

## 2023-07-02 ENCOUNTER — Other Ambulatory Visit: Payer: Self-pay | Admitting: Family Medicine

## 2023-07-02 DIAGNOSIS — J0100 Acute maxillary sinusitis, unspecified: Secondary | ICD-10-CM

## 2023-07-07 ENCOUNTER — Other Ambulatory Visit: Payer: Self-pay | Admitting: Family Medicine

## 2023-07-07 DIAGNOSIS — J449 Chronic obstructive pulmonary disease, unspecified: Secondary | ICD-10-CM

## 2023-07-10 ENCOUNTER — Other Ambulatory Visit: Payer: Self-pay | Admitting: Family Medicine

## 2023-07-10 DIAGNOSIS — J449 Chronic obstructive pulmonary disease, unspecified: Secondary | ICD-10-CM

## 2023-07-13 ENCOUNTER — Other Ambulatory Visit: Payer: Self-pay | Admitting: Family Medicine

## 2023-07-13 DIAGNOSIS — G8929 Other chronic pain: Secondary | ICD-10-CM

## 2023-07-17 ENCOUNTER — Telehealth: Payer: Self-pay | Admitting: Family Medicine

## 2023-07-17 NOTE — Telephone Encounter (Signed)
Copied from CRM [id number]. Topic: Clinical - Medication Refill >> Jul 17, 2023 10:30 AM Lorin Glass B wrote: Most Recent Primary Care Visit:  Provider: Gilmore Laroche  Department: [facility]  Visit Type: OFFICE VISIT  Date: 06/04/2023  Medication: ***  Has the patient contacted their pharmacy?  (Agent: If no, request that the patient contact the pharmacy for the refill. If patient does not wish to contact the pharmacy document the reason why and proceed with request.) (Agent: If yes, when and what did the pharmacy advise?)  Is this the correct pharmacy for this prescription?  If no, delete pharmacy and type the correct one.  This is the patient's preferred pharmacy:  [facility] [address] [place] - [address] [place] [address] [place] [address] Phone: [phone] Fax: [phone]   Has the prescription been filled recently?   Is the patient out of the medication?   Has the patient been seen for an appointment in the last year OR does the patient have an upcoming appointment?   Can we respond through MyChart?   Agent: Please be advised that Rx refills may take up to 3 business days. We ask that you follow-up with your pharmacy.

## 2023-07-20 NOTE — Telephone Encounter (Signed)
Tried calling pt back unable to reach her or leave vm.

## 2023-07-20 NOTE — Telephone Encounter (Signed)
Attempted to reach pt back unable to leave a vm.

## 2023-07-21 NOTE — Telephone Encounter (Signed)
Unable to reach pt

## 2023-07-21 NOTE — Telephone Encounter (Signed)
Prescription Request  07/21/2023  LOV: 06/04/2023  What is the name of the medication or equipment? SPIRIVA RESPIMAT 2.5 MCG/ACT AERS [[id number]]  DISCONTINUED  Patient has been out of Medication for 2 days  Have you contacted your pharmacy to request a refill? No   Which pharmacy would you like this sent to?  [facility] [address], [place] - [address] [place] [address] Phone: [phone] Fax: [phone]    Patient notified that their request is being sent to the clinical staff for review and that they should receive a response within 2 business days.   Please advise at [facility] [phone]   Patient wants a call back

## 2023-07-25 ENCOUNTER — Other Ambulatory Visit: Payer: Self-pay | Admitting: Family Medicine

## 2023-07-28 ENCOUNTER — Other Ambulatory Visit: Payer: Self-pay | Admitting: Family Medicine

## 2023-07-28 DIAGNOSIS — J449 Chronic obstructive pulmonary disease, unspecified: Secondary | ICD-10-CM

## 2023-08-01 ENCOUNTER — Other Ambulatory Visit: Payer: Self-pay | Admitting: Family Medicine

## 2023-08-01 DIAGNOSIS — J0100 Acute maxillary sinusitis, unspecified: Secondary | ICD-10-CM

## 2023-08-10 NOTE — Progress Notes (Unsigned)
   Established Patient Office Visit   Subjective  Patient ID: Kerri Walker, female    DOB: December 06, 1960  Age: 62 y.o. MRN: [id number]  No chief complaint on file.   She  has a past medical history of COPD (chronic obstructive pulmonary disease) (HCC) and Hypertension.  Patient complains of {pneum rfv:14244}. Patient describes symptoms of {pneumonia symptoms:12520}. Symptoms began {0-10:33138} {unit:11::"days"} ago and are {course:17::"unchanged"} since that time. Patient denies {pneumonia denies:14121}. Treatment thus far includes {pneumonia tx to date:14122} Past pulmonary history is significant for {resp history:412}     ROS    Objective:     There were no vitals taken for this visit. {Vitals History (Optional):23777}  Physical Exam   No results found for any visits on 08/11/23.  The 10-year ASCVD risk score (Arnett DK, et al., 2019) is: 10.4%    Assessment & Plan:  There are no diagnoses linked to this encounter.  No follow-ups on file.   Cruzita Lederer Newman Nip, FNP

## 2023-08-10 NOTE — Patient Instructions (Signed)

## 2023-08-11 ENCOUNTER — Encounter: Payer: Self-pay | Admitting: Family Medicine

## 2023-08-11 ENCOUNTER — Ambulatory Visit: Payer: Self-pay | Admitting: Family Medicine

## 2023-08-11 VITALS — BP 170/89 | HR 66 | Ht 60.0 in

## 2023-08-11 DIAGNOSIS — J069 Acute upper respiratory infection, unspecified: Secondary | ICD-10-CM

## 2023-08-11 MED ORDER — AMOXICILLIN-POT CLAVULANATE 875-125 MG PO TABS: 1.0000 | ORAL_TABLET | Freq: Two times a day (BID) | ORAL | 0 refills | Status: AC

## 2023-08-11 MED ORDER — PROMETHAZINE-DM 6.25-15 MG/5ML PO SYRP: 5.0000 mL | ORAL_SOLUTION | Freq: Four times a day (QID) | ORAL | 0 refills | Status: DC | PRN

## 2023-08-11 NOTE — Assessment & Plan Note (Signed)
Augmentin 875-125 mg twice daily x 7 days, promethazine syrup PRN Advise patient to rest to support your body's recovery. Stay hydrated by drinking water, tea, or broth. Using a humidifier can help soothe throat irritation and ease nasal congestion. For fever or pain, acetaminophen (Tylenol) is recommended. To relieve other symptoms, try saline nasal sprays, throat lozenges, or gargling with saltwater. Focus on eating light, healthy meals like fruits and vegetables to keep your strength up. Practice good hygiene by washing your hands frequently and covering your mouth when coughing or sneezing.Follow-up for worsening or persistent symptoms. Patient verbalizes understanding regarding plan of care and all questions answered

## 2023-08-13 ENCOUNTER — Other Ambulatory Visit: Payer: Self-pay | Admitting: Family Medicine

## 2023-08-13 DIAGNOSIS — I10 Essential (primary) hypertension: Secondary | ICD-10-CM

## 2023-08-13 DIAGNOSIS — G8929 Other chronic pain: Secondary | ICD-10-CM

## 2023-08-20 ENCOUNTER — Other Ambulatory Visit: Payer: Self-pay | Admitting: Family Medicine

## 2023-08-20 DIAGNOSIS — J449 Chronic obstructive pulmonary disease, unspecified: Secondary | ICD-10-CM

## 2023-08-23 ENCOUNTER — Other Ambulatory Visit: Payer: Self-pay | Admitting: Family Medicine

## 2023-08-31 ENCOUNTER — Other Ambulatory Visit: Payer: Self-pay | Admitting: Family Medicine

## 2023-08-31 DIAGNOSIS — J0100 Acute maxillary sinusitis, unspecified: Secondary | ICD-10-CM

## 2023-09-08 ENCOUNTER — Other Ambulatory Visit: Payer: Self-pay | Admitting: Family Medicine

## 2023-09-08 DIAGNOSIS — J449 Chronic obstructive pulmonary disease, unspecified: Secondary | ICD-10-CM

## 2023-09-22 ENCOUNTER — Other Ambulatory Visit: Payer: Self-pay | Admitting: Family Medicine

## 2023-09-29 ENCOUNTER — Other Ambulatory Visit: Payer: Self-pay | Admitting: Family Medicine

## 2023-09-29 DIAGNOSIS — G4700 Insomnia, unspecified: Secondary | ICD-10-CM

## 2023-09-29 DIAGNOSIS — J0100 Acute maxillary sinusitis, unspecified: Secondary | ICD-10-CM

## 2023-10-02 ENCOUNTER — Other Ambulatory Visit: Payer: Self-pay | Admitting: Family Medicine

## 2023-10-02 DIAGNOSIS — J449 Chronic obstructive pulmonary disease, unspecified: Secondary | ICD-10-CM

## 2023-10-05 ENCOUNTER — Encounter: Payer: Self-pay | Admitting: Family Medicine

## 2023-10-05 ENCOUNTER — Ambulatory Visit: Payer: 59 | Admitting: Family Medicine

## 2023-10-05 VITALS — BP 167/89 | HR 68 | Ht 60.0 in | Wt 155.0 lb

## 2023-10-05 DIAGNOSIS — E785 Hyperlipidemia, unspecified: Secondary | ICD-10-CM

## 2023-10-05 DIAGNOSIS — J449 Chronic obstructive pulmonary disease, unspecified: Secondary | ICD-10-CM

## 2023-10-05 DIAGNOSIS — J302 Other seasonal allergic rhinitis: Secondary | ICD-10-CM

## 2023-10-05 DIAGNOSIS — I10 Essential (primary) hypertension: Secondary | ICD-10-CM

## 2023-10-05 MED ORDER — ALBUTEROL SULFATE HFA 108 (90 BASE) MCG/ACT IN AERS: 1.0000 | INHALATION_SPRAY | Freq: Four times a day (QID) | RESPIRATORY_TRACT | 2 refills | Status: DC | PRN

## 2023-10-05 MED ORDER — TRELEGY ELLIPTA 100-62.5-25 MCG/ACT IN AEPB: 1.0000 | INHALATION_SPRAY | Freq: Every day | RESPIRATORY_TRACT | 11 refills | Status: DC

## 2023-10-05 MED ORDER — MONTELUKAST SODIUM 10 MG PO TABS: 10.0000 mg | ORAL_TABLET | Freq: Every day | ORAL | 0 refills | Status: DC

## 2023-10-05 NOTE — Progress Notes (Signed)
Established Patient Office Visit  Subjective:  Patient ID: CASIA CORTI, female    DOB: 1960/09/26  Age: 63 y.o. MRN: [id number]  CC:  Chief Complaint  Patient presents with   Medical Management of Chronic Issues    4 month follow up of chronic symptoms.     HPI REANNE NELLUMS is a 63 y.o. female with past medical history of Hypertension, hyperlipidemia, COPD presents for f/u of  chronic medical conditions. For the details of today's visit, please refer to the assessment and plan.     Past Medical History:  Diagnosis Date   COPD (chronic obstructive pulmonary disease) (HCC)    Hypertension     Past Surgical History:  Procedure Laterality Date   APPENDECTOMY     CESAREAN SECTION     CHOLECYSTECTOMY     TUBAL LIGATION      Family History  Problem Relation Age of Onset   Emphysema Paternal Grandfather    Stroke Paternal Grandmother    Dementia Father    Miscarriages / [place] Daughter     Social History   Socioeconomic History   Marital status: Married    Spouse name: Not on file   Number of children: Not on file   Years of education: Not on file   Highest education level: Not on file  Occupational History   Not on file  Tobacco Use   Smoking status: Every Day    Current packs/day: 0.00    Types: Cigarettes   Smokeless tobacco: Never  Vaping Use   Vaping status: Never Used  Substance and Sexual Activity   Alcohol use: Yes    Comment: rarely   Drug use: No   Sexual activity: Not Currently    Birth control/protection: Post-menopausal, Surgical    Comment: tubal  Other Topics Concern   Not on file  Social History Narrative   Not on file   Social Drivers of Health   Financial Resource Strain: Not on file  Food Insecurity: Not on file  Transportation Needs: Not on file  Physical Activity: Not on file  Stress: Not on file  Social Connections: Not on file  Intimate Partner Violence: Not on file    Outpatient Medications Prior to Visit   Medication Sig Dispense Refill   atorvastatin (LIPITOR) 10 MG tablet Take 1 tablet (10 mg total) by mouth daily. 90 tablet 1   cyclobenzaprine (FLEXERIL) 5 MG tablet TAKE 1 TABLET BY MOUTH EVERY 8 HOURS AS NEEDED 30 tablet 0   fluticasone (FLONASE) 50 MCG/ACT nasal spray Place 2 sprays into both nostrils daily. 16 g 2   gabapentin (NEURONTIN) 100 MG capsule Take 1 capsule (100 mg total) by mouth 2 (two) times daily. 60 capsule 2   hydrochlorothiazide (HYDRODIURIL) 25 MG tablet Take 1 tablet by mouth once daily 60 tablet 0   hydrOXYzine (ATARAX) 10 MG tablet Take 1 tablet by mouth three times daily as needed 90 tablet 0   losartan (COZAAR) 25 MG tablet Take 1 tablet by mouth once daily 90 tablet 0   promethazine-dextromethorphan (PROMETHAZINE-DM) 6.25-15 MG/5ML syrup Take 5 mLs by mouth 4 (four) times daily as needed. 118 mL 0   traZODone (DESYREL) 50 MG tablet TAKE 1 TO 2 TABLETS BY MOUTH AT BEDTIME AS NEEDED FOR SLEEP 180 tablet 0   albuterol (VENTOLIN HFA) 108 (90 Base) MCG/ACT inhaler INHALE 1 TO 2 PUFFS BY MOUTH EVERY 6 HOURS AS NEEDED FOR WHEEZING OR SHORTNESS OF BREATH 18 g 0  montelukast (SINGULAIR) 10 MG tablet Take 1 tablet by mouth once daily 30 tablet 0   SPIRIVA RESPIMAT 2.5 MCG/ACT AERS INHALE 2 SPRAY(S) BY MOUTH ONCE DAILY 4 g 0   Fluticasone-Umeclidin-Vilant (TRELEGY ELLIPTA) 100-62.5-25 MCG/ACT AEPB Inhale 1 puff into the lungs daily. (Patient not taking: Reported on 10/05/2023) 1 each 11   No facility-administered medications prior to visit.    Allergies  Allergen Reactions   Vasotec [Enalapril Maleate] Hives    ROS Review of Systems  Constitutional:  Negative for chills and fever.  Eyes:  Negative for visual disturbance.  Respiratory:  Negative for chest tightness and shortness of breath.   Neurological:  Negative for dizziness and headaches.      Objective:    Physical Exam HENT:     Head: Normocephalic.     Mouth/Throat:     Mouth: Mucous membranes are  moist.  Cardiovascular:     Rate and Rhythm: Normal rate.     Heart sounds: Normal heart sounds.  Pulmonary:     Effort: Pulmonary effort is normal.     Breath sounds: Normal breath sounds.  Neurological:     Mental Status: She is alert.     BP (!) 167/89   Pulse 68   Ht 5' (1.524 m)   Wt 155 lb 0.6 oz (70.3 kg)   SpO2 94%   BMI 30.28 kg/m  Wt Readings from Last 3 Encounters:  10/05/23 155 lb 0.6 oz (70.3 kg)  06/04/23 150 lb 1.3 oz (68.1 kg)  04/13/23 146 lb 1.3 oz (66.3 kg)    Lab Results  Component Value Date   TSH 3.180 04/13/2023   Lab Results  Component Value Date   WBC 9.3 04/13/2023   HGB 14.5 04/13/2023   HCT 43.6 04/13/2023   MCV 88 04/13/2023   PLT 280 04/13/2023   Lab Results  Component Value Date   NA 143 04/13/2023   K 4.9 04/13/2023   CO2 27 04/13/2023   GLUCOSE 81 04/13/2023   BUN 13 04/13/2023   CREATININE 1.01 (H) 04/13/2023   BILITOT 0.2 04/13/2023   ALKPHOS 101 04/13/2023   AST 19 04/13/2023   ALT 16 04/13/2023   PROT 6.7 04/13/2023   ALBUMIN 4.5 04/13/2023   CALCIUM 9.3 04/13/2023   EGFR 63 04/13/2023   Lab Results  Component Value Date   CHOL 210 (H) 04/13/2023   Lab Results  Component Value Date   HDL 61 04/13/2023   Lab Results  Component Value Date   LDLCALC 131 (H) 04/13/2023   Lab Results  Component Value Date   TRIG 103 04/13/2023   Lab Results  Component Value Date   CHOLHDL 3.4 04/13/2023   Lab Results  Component Value Date   HGBA1C 5.8 (H) 04/13/2023      Assessment & Plan:  Primary hypertension Assessment & Plan: Uncontrolled blood pressure in the clinic. The patient is asymptomatic and reports compliance with losartan 25 mg daily and hydrochlorothiazide 25 mg daily. Will have the patient follow-up for nurse visit in two weeks and if her BP remains elevated will make changes to her treatment regimen A low-sodium diet of less than 2,300 mg daily is recommended, along with increased moderate-intensity  physical activity, aiming for 150 minutes per week. The patient is encouraged to continue these lifestyle modifications to help manage blood pressure effectively.  The patient was educated on long-term considerations: uncontrolled hypertension can increase the risk of cardiovascular diseases, including stroke, coronary artery disease, and heart  failure.  The patient is advised to report to the emergency department if blood pressure exceeds 180/120 and is accompanied by symptoms such as headaches, chest pain, palpitations, blurred vision, or dizziness.    Chronic obstructive pulmonary disease, unspecified COPD type (HCC) Assessment & Plan: The patient reports daily use of her albuterol inhaler. She states that the [facility] inhaler was not covered by insurance and that she was not notified by the pharmacy that Trelegy was ready for pickup. She has been out of her maintenance inhaler for a few months.  A refill of Trelegy will be sent to her pharmacy, and she is encouraged to start therapy. The patient is advised to rinse her mouth with water without swallowing after using Trelegy to help reduce the risk of thrush.  She is encouraged to inform me if she continues to use her rescue inhaler daily while on maintenance therapy, as this may indicate that her asthma is not well controlled and that alternative treatment options may need to be explored.  The patient verbalizes understanding.   Orders: -     Albuterol Sulfate HFA; Inhale 1-2 puffs into the lungs every 6 (six) hours as needed for wheezing or shortness of breath.  Dispense: 18 g; Refill: 2 -     Trelegy Ellipta; Inhale 1 puff into the lungs daily.  Dispense: 60 each; Refill: 11  Hyperlipidemia LDL goal <100 Assessment & Plan: The patient was encouraged to make lifestyle changes, including avoiding simple carbohydrates such as cakes, sweet desserts, ice cream, soda (diet or regular), sweet tea, candies, chips, cookies, store-bought  juices, excessive alcohol (more than 1-2 drinks per day), lemonade, artificial sweeteners, donuts, coffee creamers, and sugar-free products. Additionally, reducing the consumption of greasy, fatty foods and increasing physical activity were recommended. The patient verbalized understanding and is aware of the plan of care.  Lab Results  Component Value Date   CHOL 210 (H) 04/13/2023   HDL 61 04/13/2023   LDLCALC 131 (H) 04/13/2023   TRIG 103 04/13/2023   CHOLHDL 3.4 04/13/2023      Seasonal allergic rhinitis, unspecified trigger -     Montelukast Sodium; Take 1 tablet (10 mg total) by mouth daily.  Dispense: 30 tablet; Refill: 0  Note: This chart has been completed using Engineer, civil (consulting) software, and while attempts have been made to ensure accuracy, certain words and phrases may not be transcribed as intended.    Follow-up: No follow-ups on file.   Gilmore Laroche, FNP

## 2023-10-05 NOTE — Assessment & Plan Note (Signed)
The patient reports daily use of her albuterol inhaler. She states that the [facility] inhaler was not covered by insurance and that she was not notified by the pharmacy that Trelegy was ready for pickup. She has been out of her maintenance inhaler for a few months.  A refill of Trelegy will be sent to her pharmacy, and she is encouraged to start therapy. The patient is advised to rinse her mouth with water without swallowing after using Trelegy to help reduce the risk of thrush.  She is encouraged to inform me if she continues to use her rescue inhaler daily while on maintenance therapy, as this may indicate that her asthma is not well controlled and that alternative treatment options may need to be explored.  The patient verbalizes understanding.

## 2023-10-05 NOTE — Patient Instructions (Addendum)
I appreciate the opportunity to provide care to you today!    Follow up:  2 weeks BP nurse visit BP 4 months  Labs: please stop by the lab during the week to get your blood drawn (CBC, CMP, TSH, Lipid profile, HgA1c, Vit D)  Here are some foods to avoid or reduce in your diet to help manage cholesterol levels:  Fried Foods:Deep-fried items such as french fries, fried chicken, and fried snacks are high in unhealthy fats and can raise LDL (bad) cholesterol levels. Processed Meats:Foods like bacon, sausage, hot dogs, and deli meats are often high in saturated fat and cholesterol. Full-Fat Dairy Products:Whole milk, full-fat yogurt, butter, cream, and cheese are rich in saturated fats, which can increase cholesterol levels. Baked Goods and Sweets:Pastries, cakes, cookies, and donuts often contain trans fats and added sugars, which can raise LDL cholesterol and lower HDL (good) cholesterol. Red Meat:Beef, lamb, and pork are high in saturated fat. Lean cuts or plant-based protein alternatives are better options. Lard and Shortening:Used in some baked goods, lard and shortening are high in trans fats and should be avoided. Fast Food:Many fast food items are cooked with unhealthy oils and contain high amounts of saturated and trans fats. Processed Snacks:Chips, crackers, and certain microwave popcorns can contain trans fats and high levels of unhealthy oils. Shellfish:While nutritious in other ways, some shellfish like shrimp, lobster, and crab are high in cholesterol. They should be consumed in moderation. Coconut and Palm Oils:these oils are high in saturated fat and can raise cholesterol levels when used in cooking or baking.     Please continue to a heart-healthy diet and increase your physical activities. Try to exercise for at least five days a week.    It was a pleasure to see you and I look forward to continuing to work together on your health and well-being. Please do not hesitate to  call the office if you need care or have questions about your care.  In case of emergency, please visit the Emergency Department for urgent care, or contact our clinic at [phone] to schedule an appointment. We're here to help you!   Have a wonderful day and week. With Gratitude, Gilmore Laroche MSN, FNP-BC

## 2023-10-05 NOTE — Assessment & Plan Note (Signed)
Uncontrolled blood pressure in the clinic. The patient is asymptomatic and reports compliance with losartan 25 mg daily and hydrochlorothiazide 25 mg daily. Will have the patient follow-up for nurse visit in two weeks and if her BP remains elevated will make changes to her treatment regimen A low-sodium diet of less than 2,300 mg daily is recommended, along with increased moderate-intensity physical activity, aiming for 150 minutes per week. The patient is encouraged to continue these lifestyle modifications to help manage blood pressure effectively.  The patient was educated on long-term considerations: uncontrolled hypertension can increase the risk of cardiovascular diseases, including stroke, coronary artery disease, and heart failure.  The patient is advised to report to the emergency department if blood pressure exceeds 180/120 and is accompanied by symptoms such as headaches, chest pain, palpitations, blurred vision, or dizziness.

## 2023-10-05 NOTE — Assessment & Plan Note (Addendum)
The patient was encouraged to make lifestyle changes, including avoiding simple carbohydrates such as cakes, sweet desserts, ice cream, soda (diet or regular), sweet tea, candies, chips, cookies, store-bought juices, excessive alcohol (more than 1-2 drinks per day), lemonade, artificial sweeteners, donuts, coffee creamers, and sugar-free products. Additionally, reducing the consumption of greasy, fatty foods and increasing physical activity were recommended. The patient verbalized understanding and is aware of the plan of care.  Lab Results  Component Value Date   CHOL 210 (H) 04/13/2023   HDL 61 04/13/2023   LDLCALC 131 (H) 04/13/2023   TRIG 103 04/13/2023   CHOLHDL 3.4 04/13/2023

## 2023-10-19 ENCOUNTER — Ambulatory Visit: Payer: 59

## 2023-10-23 ENCOUNTER — Other Ambulatory Visit: Payer: Self-pay | Admitting: Family Medicine

## 2023-11-23 ENCOUNTER — Other Ambulatory Visit: Payer: Self-pay | Admitting: Family Medicine

## 2023-11-29 ENCOUNTER — Other Ambulatory Visit: Payer: Self-pay | Admitting: Family Medicine

## 2023-11-29 DIAGNOSIS — J302 Other seasonal allergic rhinitis: Secondary | ICD-10-CM

## 2023-12-21 ENCOUNTER — Other Ambulatory Visit: Payer: Self-pay | Admitting: Family Medicine

## 2023-12-30 ENCOUNTER — Other Ambulatory Visit: Payer: Self-pay | Admitting: Family Medicine

## 2023-12-30 DIAGNOSIS — G8929 Other chronic pain: Secondary | ICD-10-CM

## 2023-12-30 DIAGNOSIS — I10 Essential (primary) hypertension: Secondary | ICD-10-CM

## 2023-12-30 DIAGNOSIS — G4700 Insomnia, unspecified: Secondary | ICD-10-CM

## 2024-01-04 ENCOUNTER — Other Ambulatory Visit: Payer: Self-pay | Admitting: Family Medicine

## 2024-01-04 DIAGNOSIS — G8929 Other chronic pain: Secondary | ICD-10-CM

## 2024-01-06 ENCOUNTER — Other Ambulatory Visit: Payer: Self-pay | Admitting: Family Medicine

## 2024-01-06 DIAGNOSIS — J449 Chronic obstructive pulmonary disease, unspecified: Secondary | ICD-10-CM

## 2024-01-21 ENCOUNTER — Emergency Department (HOSPITAL_COMMUNITY)
Admission: EM | Admit: 2024-01-21 | Discharge: 2024-01-21 | Disposition: A | Discharge status: Home / Self Care | Attending: Emergency Medicine | Admitting: Emergency Medicine

## 2024-01-21 ENCOUNTER — Emergency Department (HOSPITAL_COMMUNITY)

## 2024-01-21 ENCOUNTER — Other Ambulatory Visit: Payer: Self-pay

## 2024-01-21 ENCOUNTER — Encounter (HOSPITAL_COMMUNITY): Payer: Self-pay | Admitting: Emergency Medicine

## 2024-01-21 DIAGNOSIS — R109 Unspecified abdominal pain: Secondary | ICD-10-CM

## 2024-01-21 DIAGNOSIS — J9811 Atelectasis: Secondary | ICD-10-CM | POA: Diagnosis not present

## 2024-01-21 DIAGNOSIS — R0602 Shortness of breath: Secondary | ICD-10-CM | POA: Diagnosis not present

## 2024-01-21 DIAGNOSIS — J929 Pleural plaque without asbestos: Secondary | ICD-10-CM | POA: Diagnosis not present

## 2024-01-21 DIAGNOSIS — R1011 Right upper quadrant pain: Secondary | ICD-10-CM | POA: Insufficient documentation

## 2024-01-21 DIAGNOSIS — R110 Nausea: Secondary | ICD-10-CM | POA: Insufficient documentation

## 2024-01-21 DIAGNOSIS — R0600 Dyspnea, unspecified: Secondary | ICD-10-CM | POA: Diagnosis not present

## 2024-01-21 DIAGNOSIS — R0989 Other specified symptoms and signs involving the circulatory and respiratory systems: Secondary | ICD-10-CM | POA: Diagnosis not present

## 2024-01-21 LAB — COMPREHENSIVE METABOLIC PANEL WITH GFR
ALT: 16 U/L (ref 0–44)
AST: 18 U/L (ref 15–41)
Albumin: 3.7 g/dL (ref 3.5–5.0)
Alkaline Phosphatase: 81 U/L (ref 38–126)
Anion gap: 8 (ref 5–15)
BUN: 8 mg/dL (ref 8–23)
CO2: 30 mmol/L (ref 22–32)
Calcium: 8.8 mg/dL — ABNORMAL LOW (ref 8.9–10.3)
Chloride: 98 mmol/L (ref 98–111)
Creatinine, Ser: 1.04 mg/dL — ABNORMAL HIGH (ref 0.44–1.00)
GFR, Estimated: >60 mL/min (ref >60)
Glucose, Bld: 88 mg/dL (ref 70–99)
Potassium: 3.4 mmol/L — ABNORMAL LOW (ref 3.5–5.1)
Sodium: 136 mmol/L (ref 135–145)
Total Bilirubin: 0.7 mg/dL (ref 0.0–1.2)
Total Protein: 7 g/dL (ref 6.5–8.1)

## 2024-01-21 LAB — CBC WITH DIFFERENTIAL/PLATELET
Abs Immature Granulocytes: 0.05 10*3/uL (ref 0.00–0.07)
Basophils Absolute: 0 10*3/uL (ref 0.0–0.1)
Basophils Relative: 0 %
Eosinophils Absolute: 0.2 10*3/uL (ref 0.0–0.5)
Eosinophils Relative: 2 %
HCT: 43.9 % (ref 36.0–46.0)
Hemoglobin: 14.2 g/dL (ref 12.0–15.0)
Immature Granulocytes: 1 %
Lymphocytes Relative: 18 %
Lymphs Abs: 1.7 10*3/uL (ref 0.7–4.0)
MCH: 28.9 pg (ref 26.0–34.0)
MCHC: 32.3 g/dL (ref 30.0–36.0)
MCV: 89.4 fL (ref 80.0–100.0)
Monocytes Absolute: 0.6 10*3/uL (ref 0.1–1.0)
Monocytes Relative: 6 %
Neutro Abs: 6.7 10*3/uL (ref 1.7–7.7)
Neutrophils Relative %: 73 %
Platelets: 247 10*3/uL (ref 150–400)
RBC: 4.91 MIL/uL (ref 3.87–5.11)
RDW: 13.1 % (ref 11.5–15.5)
WBC: 9.1 10*3/uL (ref 4.0–10.5)
nRBC: 0 % (ref 0.0–0.2)

## 2024-01-21 LAB — URINALYSIS, ROUTINE W REFLEX MICROSCOPIC
Bilirubin Urine: NEGATIVE
Glucose, UA: NEGATIVE mg/dL
Ketones, ur: NEGATIVE mg/dL
Nitrite: NEGATIVE
Protein, ur: NEGATIVE mg/dL
Specific Gravity, Urine: 1.003 — ABNORMAL LOW (ref 1.005–1.030)
pH: 7 (ref 5.0–8.0)

## 2024-01-21 LAB — D-DIMER, QUANTITATIVE: D-Dimer, Quant: 0.57 ug{FEU}/mL — ABNORMAL HIGH (ref 0.00–0.50)

## 2024-01-21 LAB — LIPASE, BLOOD: Lipase: 31 U/L (ref 11–51)

## 2024-01-21 LAB — TROPONIN I (HIGH SENSITIVITY): Troponin I (High Sensitivity): 5 ng/L (ref <18)

## 2024-01-21 LAB — BRAIN NATRIURETIC PEPTIDE: B Natriuretic Peptide: 26 pg/mL (ref 0.0–100.0)

## 2024-01-21 MED ORDER — IPRATROPIUM-ALBUTEROL 0.5-2.5 (3) MG/3ML IN SOLN
3.0000 mL | Freq: Once | RESPIRATORY_TRACT | Status: AC
  Administered 2024-01-21: 3 mL via RESPIRATORY_TRACT
  Filled 2024-01-21: qty 3

## 2024-01-21 MED ORDER — TIZANIDINE HCL 4 MG PO TABS: 4.0000 mg | ORAL_TABLET | Freq: Four times a day (QID) | ORAL | 0 refills | Status: DC | PRN

## 2024-01-21 MED ORDER — LIDOCAINE 5 % EX PTCH
1.0000 | MEDICATED_PATCH | Freq: Every day | CUTANEOUS | 0 refills | Status: AC | PRN
Start: 2024-01-21 — End: ?

## 2024-01-21 MED ORDER — IOHEXOL 300 MG/ML  SOLN
100.0000 mL | Freq: Once | INTRAMUSCULAR | Status: AC | PRN
  Administered 2024-01-21: 100 mL via INTRAVENOUS

## 2024-01-21 MED ORDER — PREDNISONE 10 MG PO TABS: 40.0000 mg | ORAL_TABLET | Freq: Every day | ORAL | 0 refills | Status: AC

## 2024-01-21 NOTE — ED Notes (Signed)
 Pt ambulated to the bathroom.

## 2024-01-21 NOTE — Discharge Instructions (Signed)
 Please follow-up closely with your primary care doctor on an outpatient basis.  Return to emergency department immediately for any new or worsening symptoms.

## 2024-01-21 NOTE — ED Provider Notes (Signed)
  EMERGENCY DEPARTMENT AT [facility] Provider Note   CSN: [id number] Arrival date & time: 01/21/24  5188     History  Chief Complaint  Patient presents with   Abdominal Pain   Shortness of Breath    Kerri Walker is a 63 y.o. female.  Patient is a 63 year old female who presents to the emergency department with a chief complaint of shortness of breath and right-sided flank pain which has been ongoing for approximate the past 4 days.  She does note that the pain in her right flank and back is worse with sitting and lying but notes that it does improve with standing.  Patient denies any recent falls or blunt trauma to her back.  She has had no urinary bowel incontinence, saddle paresthesias, gait changes, fever or chills.  Patient denies any chest pain.  She does note that her shortness of breath is worse with exertion.  She has had no increased lower extremity edema, hemoptysis, recent long travel or surgeries.  She denies any dizziness, lightheadedness or syncope.  She has had no dysuria or hematuria.  She does admit to some associated nausea without vomiting.  She does admit to constipation.   Abdominal Pain Associated symptoms: shortness of breath   Shortness of Breath Associated symptoms: abdominal pain        Home Medications Prior to Admission medications   Medication Sig Start Date End Date Taking? Authorizing Provider  albuterol  (VENTOLIN  HFA) 108 (90 Base) MCG/ACT inhaler INHALE 1 TO 2 PUFFS BY MOUTH EVERY 6 HOURS AS NEEDED FOR WHEEZING OR SHORTNESS OF BREATH 01/06/24   Zarwolo, Gloria, FNP  atorvastatin  (LIPITOR) 10 MG tablet Take 1 tablet (10 mg total) by mouth daily. 04/20/23   Zarwolo, Gloria, FNP  cyclobenzaprine  (FLEXERIL ) 5 MG tablet TAKE 1 TABLET BY MOUTH EVERY 8 HOURS AS NEEDED 12/30/23   Zarwolo, Gloria, FNP  fluticasone  (FLONASE ) 50 MCG/ACT nasal spray Place 2 sprays into both nostrils daily. 04/13/23   Zarwolo, Gloria, FNP   Fluticasone -Umeclidin-Vilant (TRELEGY ELLIPTA ) 100-62.5-25 MCG/ACT AEPB Inhale 1 puff into the lungs daily. 10/05/23   Zarwolo, Gloria, FNP  gabapentin  (NEURONTIN ) 100 MG capsule Take 1 capsule (100 mg total) by mouth 2 (two) times daily. 04/13/23   Zarwolo, Gloria, FNP  hydrochlorothiazide  (HYDRODIURIL ) 25 MG tablet Take 1 tablet by mouth once daily 12/30/23   Zarwolo, Gloria, FNP  hydrOXYzine  (ATARAX ) 10 MG tablet Take 1 tablet by mouth three times daily as needed 12/21/23   Zarwolo, Gloria, FNP  losartan  (COZAAR ) 25 MG tablet Take 1 tablet by mouth once daily 12/30/23   Zarwolo, Gloria, FNP  montelukast  (SINGULAIR ) 10 MG tablet Take 1 tablet by mouth once daily 11/30/23   Zarwolo, Gloria, FNP  promethazine -dextromethorphan (PROMETHAZINE -DM) 6.25-15 MG/5ML syrup Take 5 mLs by mouth 4 (four) times daily as needed. 08/11/23   Del Orbe Polanco, Rogerio Clay, FNP  traZODone  (DESYREL ) 50 MG tablet TAKE 1 TO 2 TABLETS BY MOUTH AT BEDTIME AS NEEDED FOR SLEEP 12/30/23   Zarwolo, Gloria, FNP      Allergies    Vasotec [enalapril maleate]    Review of Systems   Review of Systems  Respiratory:  Positive for shortness of breath.   Gastrointestinal:  Positive for abdominal pain.  All other systems reviewed and are negative.   Physical Exam Updated Vital Signs BP (!) 176/82 (BP Location: Left Arm)   Pulse 63   Temp 98 F (36.7 C) (Oral)   Resp 16   Ht 5' (  1.524 m)   Wt 70.3 kg   SpO2 95%   BMI 30.27 kg/m  Physical Exam Vitals and nursing note reviewed.  Constitutional:      Appearance: Normal appearance.  HENT:     Head: Normocephalic and atraumatic.     Nose: Nose normal.     Mouth/Throat:     Mouth: Mucous membranes are moist.  Eyes:     Extraocular Movements: Extraocular movements intact.     Conjunctiva/sclera: Conjunctivae normal.     Pupils: Pupils are equal, round, and reactive to light.  Cardiovascular:     Rate and Rhythm: Normal rate and regular rhythm.     Pulses: Normal pulses.      Heart sounds: Normal heart sounds. No murmur heard.    No gallop.  Pulmonary:     Effort: Pulmonary effort is normal. No respiratory distress.     Breath sounds: No stridor. Wheezing present. No rhonchi or rales.  Abdominal:     General: Abdomen is flat. Bowel sounds are normal. There is no distension. There are no signs of injury.     Palpations: Abdomen is soft. There is no mass.     Tenderness: There is abdominal tenderness in the right upper quadrant. There is no guarding.     Hernia: No hernia is present.  Musculoskeletal:        General: Normal range of motion.     Cervical back: Normal range of motion and neck supple.  Skin:    General: Skin is warm and dry.     Coloration: Skin is not jaundiced.     Findings: No rash.  Neurological:     General: No focal deficit present.     Mental Status: She is alert and oriented to person, place, and time. Mental status is at baseline.     Cranial Nerves: No cranial nerve deficit.     Motor: No weakness.  Psychiatric:        Mood and Affect: Mood normal.        Behavior: Behavior normal.        Thought Content: Thought content normal.        Judgment: Judgment normal.     ED Results / Procedures / Treatments   Labs (all labs ordered are listed, but only abnormal results are displayed) Labs Reviewed  COMPREHENSIVE METABOLIC PANEL WITH GFR  CBC WITH DIFFERENTIAL/PLATELET  URINALYSIS, ROUTINE W REFLEX MICROSCOPIC  D-DIMER, QUANTITATIVE  TROPONIN I (HIGH SENSITIVITY)    EKG None  Radiology No results found.  Procedures Procedures    Medications Ordered in ED Medications - No data to display  ED Course/ Medical Decision Making/ A&P Clinical Course as of 01/21/24 1416  Thu Jan 21, 2024  1226 CT ABDOMEN PELVIS W CONTRAST [CR]    Clinical Course User Index [CR] Roselynn Connors, PA-C                                 Medical Decision Making Amount and/or Complexity of Data Reviewed Labs: ordered. Radiology:  ordered. Decision-making details documented in ED Course.  Risk Prescription drug management.   This patient presents to the ED for concern of right flank pain, shortness of breath differential diagnosis includes ACS, pulmonary embolus, pneumonia, pneumothorax, hemothorax, diverticulitis, pyelonephritis, kidney stone, musculoskeletal pain    Additional history obtained:  Additional history obtained from none External records from outside source obtained and reviewed including none  Lab Tests:  I Ordered, and personally interpreted labs.  The pertinent results include: No leukocytosis, no anemia, unremarkable electrolytes, unremarkable kidney function liver function, urinalysis unremarkable, negative troponin, negative BNP, negative lipase   Imaging Studies ordered:  I ordered imaging studies including CT scan of abdomen and pelvis, chest x-ray I independently visualized and interpreted imaging which showed no acute intra-abdominal surgical process, no acute cardiopulmonary process I agree with the radiologist interpretation   Medicines ordered and prescription drug management:  I ordered medication including DuoNeb for shortness of breath Reevaluation of the patient after these medicines showed that the patient improved I have reviewed the patients home medicines and have made adjustments as needed   Problem List / ED Course:  And is doing well at this time and is stable for discharge home.  Discussed with patient that symptoms are most consistent with underlying musculoskeletal etiology of her pain at this point.  Patient had a normal age-adjusted D-dimer.  Troponins were within normal limits and EKG had no acute ischemic changes.  Do not suspect ACS at this time.  CT scan of abdomen and pelvis was unremarked for any signs of acute intra-abdominal surgical process.  Blood work was otherwise unremarkable as well.  Pain is reproducible with palpation over the right lateral aspect  of the chest and flank.  Will continue symptomatic treatment on outpatient basis and recommend close follow-up with primary care doctor.  Strict turn precautions were discussed for any new or worsening symptoms.  Patient voiced understanding to the plan and had no additional questions.  Patient was fully evaluated by attending physician who is in agreement to plan at this time.   Social Determinants of Health:  None           Final Clinical Impression(s) / ED Diagnoses Final diagnoses:  None    Rx / DC Orders ED Discharge Orders     None         Roselynn Connors, PA-C 01/21/24 1419    Russella Courts A, DO 01/28/24 2357

## 2024-01-21 NOTE — ED Triage Notes (Signed)
 Pt bib pov w/ c/o right posterior flank/abd pain starting Sunday. Pt reports pain is relieved when standing but increases when sitting or lying. Pt reports her legs feel tired and heavy when walking. Pt denies any unusual activities or known trauma. Pt denies any urinary symptoms. Pt reports chronic constipation, but had a small BM this morning.   Pt has hx of COPD but has worsening SOB. Pt is a current smoker. Pt has been using an albuterol  inhaler. Pt is prescribed a preventative inhaler, but has run out of that medication. Pt is unable to afford that medication.

## 2024-01-23 ENCOUNTER — Other Ambulatory Visit: Payer: Self-pay | Admitting: Family Medicine

## 2024-01-27 ENCOUNTER — Other Ambulatory Visit: Payer: Self-pay | Admitting: Family Medicine

## 2024-01-27 DIAGNOSIS — J449 Chronic obstructive pulmonary disease, unspecified: Secondary | ICD-10-CM

## 2024-02-04 ENCOUNTER — Ambulatory Visit: Payer: Self-pay | Admitting: Family Medicine

## 2024-02-15 ENCOUNTER — Ambulatory Visit: Admitting: Family Medicine

## 2024-02-22 ENCOUNTER — Other Ambulatory Visit: Payer: Self-pay | Admitting: Family Medicine

## 2024-02-22 DIAGNOSIS — J449 Chronic obstructive pulmonary disease, unspecified: Secondary | ICD-10-CM

## 2024-02-28 ENCOUNTER — Other Ambulatory Visit: Payer: Self-pay | Admitting: Family Medicine

## 2024-03-03 ENCOUNTER — Encounter: Payer: Self-pay | Admitting: Family Medicine

## 2024-03-03 ENCOUNTER — Ambulatory Visit: Admitting: Family Medicine

## 2024-03-03 VITALS — BP 121/82 | HR 83 | Ht 60.0 in | Wt 151.1 lb

## 2024-03-03 DIAGNOSIS — E559 Vitamin D deficiency, unspecified: Secondary | ICD-10-CM

## 2024-03-03 DIAGNOSIS — J449 Chronic obstructive pulmonary disease, unspecified: Secondary | ICD-10-CM

## 2024-03-03 DIAGNOSIS — E7849 Other hyperlipidemia: Secondary | ICD-10-CM | POA: Diagnosis not present

## 2024-03-03 DIAGNOSIS — M62838 Other muscle spasm: Secondary | ICD-10-CM | POA: Diagnosis not present

## 2024-03-03 DIAGNOSIS — E038 Other specified hypothyroidism: Secondary | ICD-10-CM

## 2024-03-03 DIAGNOSIS — R7301 Impaired fasting glucose: Secondary | ICD-10-CM | POA: Diagnosis not present

## 2024-03-03 DIAGNOSIS — I10 Essential (primary) hypertension: Secondary | ICD-10-CM | POA: Diagnosis not present

## 2024-03-03 DIAGNOSIS — E876 Hypokalemia: Secondary | ICD-10-CM

## 2024-03-03 MED ORDER — OLMESARTAN MEDOXOMIL-HCTZ 40-12.5 MG PO TABS: 1.0000 | ORAL_TABLET | Freq: Every day | ORAL | 1 refills | Status: AC

## 2024-03-03 MED ORDER — TIZANIDINE HCL 4 MG PO TABS: 4.0000 mg | ORAL_TABLET | Freq: Four times a day (QID) | ORAL | 0 refills | Status: DC | PRN

## 2024-03-03 MED ORDER — POTASSIUM CHLORIDE CRYS ER 10 MEQ PO TBCR: 10.0000 meq | EXTENDED_RELEASE_TABLET | Freq: Every day | ORAL | 1 refills | Status: AC

## 2024-03-03 MED ORDER — ATORVASTATIN CALCIUM 10 MG PO TABS: 10.0000 mg | ORAL_TABLET | Freq: Every day | ORAL | 1 refills | Status: AC

## 2024-03-03 NOTE — Assessment & Plan Note (Signed)
 Refill tizanidine

## 2024-03-03 NOTE — Progress Notes (Signed)
 Established Patient Office Visit  Subjective:  Patient ID: Kerri Walker, female    DOB: 31-Jul-1961  Age: 63 y.o. MRN: [id number]  CC:  Chief Complaint  Patient presents with   Medical Management of Chronic Issues    4 month follow up    HPI Kerri Walker is a 63 y.o. female with past medical history of hypertension, COPD, insomnia, hyperlipidemia presents for f/u of  chronic medical conditions.  COPD:The patient reports that the Trelegy inhaler costs approximately $200, making it unaffordable. As a result, she has not been using it consistently and has been relying on her rescue inhaler for symptom relief.   Past Medical History:  Diagnosis Date   COPD (chronic obstructive pulmonary disease) (HCC)    Hypertension     Past Surgical History:  Procedure Laterality Date   APPENDECTOMY     CESAREAN SECTION     CHOLECYSTECTOMY     TUBAL LIGATION      Family History  Problem Relation Age of Onset   Emphysema Paternal Grandfather    Stroke Paternal Grandmother    Dementia Father    Miscarriages / [place] Daughter     Social History   Socioeconomic History   Marital status: Married    Spouse name: Not on file   Number of children: Not on file   Years of education: Not on file   Highest education level: Not on file  Occupational History   Not on file  Tobacco Use   Smoking status: Every Day    Current packs/day: 0.00    Types: Cigarettes   Smokeless tobacco: Never  Vaping Use   Vaping status: Never Used  Substance and Sexual Activity   Alcohol use: Yes    Comment: rarely   Drug use: No   Sexual activity: Not Currently    Birth control/protection: Post-menopausal, Surgical    Comment: tubal  Other Topics Concern   Not on file  Social History Narrative   Not on file   Social Drivers of Health   Financial Resource Strain: Not on file  Food Insecurity: Not on file  Transportation Needs: Not on file  Physical Activity: Not on file  Stress: Not  on file  Social Connections: Not on file  Intimate Partner Violence: Not on file    Outpatient Medications Prior to Visit  Medication Sig Dispense Refill   albuterol  (VENTOLIN  HFA) 108 (90 Base) MCG/ACT inhaler INHALE 1 TO 2 PUFFS BY MOUTH EVERY 6 HOURS AS NEEDED FOR WHEEZING FOR SHORTNESS OF BREATH 18 g 0   fluticasone  (FLONASE ) 50 MCG/ACT nasal spray Place 2 sprays into both nostrils daily. 16 g 2   Fluticasone -Umeclidin-Vilant (TRELEGY ELLIPTA ) 100-62.5-25 MCG/ACT AEPB Inhale 1 puff into the lungs daily. 60 each 11   gabapentin  (NEURONTIN ) 100 MG capsule Take 1 capsule (100 mg total) by mouth 2 (two) times daily. 60 capsule 2   hydrOXYzine  (ATARAX ) 10 MG tablet Take 1 tablet by mouth three times daily as needed 90 tablet 0   lidocaine  (LIDODERM ) 5 % Place 1 patch onto the skin daily as needed. Remove & Discard patch within 12 hours or as directed by MD 15 patch 0   montelukast  (SINGULAIR ) 10 MG tablet Take 1 tablet by mouth once daily 30 tablet 0   promethazine -dextromethorphan (PROMETHAZINE -DM) 6.25-15 MG/5ML syrup Take 5 mLs by mouth 4 (four) times daily as needed. 118 mL 0   traZODone  (DESYREL ) 50 MG tablet TAKE 1 TO 2 TABLETS BY MOUTH AT  BEDTIME AS NEEDED FOR SLEEP 180 tablet 0   atorvastatin  (LIPITOR) 10 MG tablet Take 1 tablet (10 mg total) by mouth daily. 90 tablet 1   hydrochlorothiazide  (HYDRODIURIL ) 25 MG tablet Take 1 tablet by mouth once daily 60 tablet 0   losartan  (COZAAR ) 25 MG tablet Take 1 tablet by mouth once daily 90 tablet 0   tiZANidine  (ZANAFLEX ) 4 MG tablet Take 1 tablet (4 mg total) by mouth every 6 (six) hours as needed for muscle spasms. 15 tablet 0   No facility-administered medications prior to visit.    Allergies  Allergen Reactions   Vasotec [Enalapril Maleate] Hives    ROS Review of Systems  Constitutional:  Negative for chills and fever.  Eyes:  Negative for visual disturbance.  Respiratory:  Negative for chest tightness and shortness of breath.    Neurological:  Negative for dizziness and headaches.      Objective:    Physical Exam HENT:     Head: Normocephalic.     Mouth/Throat:     Mouth: Mucous membranes are moist.  Cardiovascular:     Rate and Rhythm: Normal rate.     Heart sounds: Normal heart sounds.  Pulmonary:     Effort: Pulmonary effort is normal.     Breath sounds: Normal breath sounds.  Neurological:     Mental Status: She is alert.     BP 121/82   Pulse 83   Ht 5' (1.524 m)   Wt 151 lb 1.9 oz (68.5 kg)   SpO2 91%   BMI 29.51 kg/m  Wt Readings from Last 3 Encounters:  03/03/24 151 lb 1.9 oz (68.5 kg)  01/21/24 155 lb (70.3 kg)  10/05/23 155 lb 0.6 oz (70.3 kg)    Lab Results  Component Value Date   TSH 3.180 04/13/2023   Lab Results  Component Value Date   WBC 9.1 01/21/2024   HGB 14.2 01/21/2024   HCT 43.9 01/21/2024   MCV 89.4 01/21/2024   PLT 247 01/21/2024   Lab Results  Component Value Date   NA 136 01/21/2024   K 3.4 (L) 01/21/2024   CO2 30 01/21/2024   GLUCOSE 88 01/21/2024   BUN 8 01/21/2024   CREATININE 1.04 (H) 01/21/2024   BILITOT 0.7 01/21/2024   ALKPHOS 81 01/21/2024   AST 18 01/21/2024   ALT 16 01/21/2024   PROT 7.0 01/21/2024   ALBUMIN 3.7 01/21/2024   CALCIUM  8.8 (L) 01/21/2024   ANIONGAP 8 01/21/2024   EGFR 63 04/13/2023   Lab Results  Component Value Date   CHOL 210 (H) 04/13/2023   Lab Results  Component Value Date   HDL 61 04/13/2023   Lab Results  Component Value Date   LDLCALC 131 (H) 04/13/2023   Lab Results  Component Value Date   TRIG 103 04/13/2023   Lab Results  Component Value Date   CHOLHDL 3.4 04/13/2023   Lab Results  Component Value Date   HGBA1C 5.8 (H) 04/13/2023      Assessment & Plan:  Primary hypertension Assessment & Plan: The patient reports increased fatigue while taking losartan  50 mg daily and hydrochlorothiazide  25 mg daily. We will discontinue her current treatment regimen and initiate therapy with  olmesartan-hydrochlorothiazide  40-12.5 mg daily as a combination pill to help reduce pill burden. The patient is encouraged to monitor her blood pressure daily and to follow up if readings are consistently less than 90/60 mmHg or greater than 140/90 mmHg. A low-sodium diet and increased physical  activity are also recommended to support blood pressure control.   Orders: -     Olmesartan Medoxomil-HCTZ; Take 1 tablet by mouth daily.  Dispense: 90 tablet; Refill: 1  Hypokalemia -     Potassium Chloride Crys ER; Take 1 tablet (10 mEq total) by mouth daily.  Dispense: 90 tablet; Refill: 1  Chronic obstructive pulmonary disease, unspecified COPD type (HCC) Assessment & Plan: A referral has been placed to the pharmacy for medication assistance. A sample Trelegy inhaler will be provided today in the clinic. Smoking cessation is encouraged to support improved respiratory health and COPD management.   Orders: -     AMB Referral VBCI Care Management  Muscle spasm Assessment & Plan: Refill tizanidine   Orders: -     tiZANidine  HCl; Take 1 tablet (4 mg total) by mouth every 6 (six) hours as needed for muscle spasms.  Dispense: 20 tablet; Refill: 0  IFG (impaired fasting glucose) -     Hemoglobin A1c  Vitamin D  deficiency -     VITAMIN D  25 Hydroxy (Vit-D Deficiency, Fractures)  TSH (thyroid-stimulating hormone deficiency) -     TSH + free T4  Other hyperlipidemia -     Lipid panel -     CMP14+EGFR -     CBC with Differential/Platelet -     Atorvastatin  Calcium ; Take 1 tablet (10 mg total) by mouth daily.  Dispense: 90 tablet; Refill: 1   Note: This chart has been completed using Engineer, civil (consulting) software, and while attempts have been made to ensure accuracy, certain words and phrases may not be transcribed as intended.   Follow-up: Return in about 4 months (around 07/04/2024).   Essa Malachi, FNP

## 2024-03-03 NOTE — Assessment & Plan Note (Signed)
 The patient reports increased fatigue while taking losartan  50 mg daily and hydrochlorothiazide  25 mg daily. We will discontinue her current treatment regimen and initiate therapy with olmesartan-hydrochlorothiazide  40-12.5 mg daily as a combination pill to help reduce pill burden. The patient is encouraged to monitor her blood pressure daily and to follow up if readings are consistently less than 90/60 mmHg or greater than 140/90 mmHg. A low-sodium diet and increased physical activity are also recommended to support blood pressure control.

## 2024-03-03 NOTE — Assessment & Plan Note (Signed)
 A referral has been placed to the pharmacy for medication assistance. A sample Trelegy inhaler will be provided today in the clinic. Smoking cessation is encouraged to support improved respiratory health and COPD management.

## 2024-03-03 NOTE — Patient Instructions (Addendum)
 I appreciate the opportunity to provide care to you today!    Follow up:  4 months  Labs: please stop by the lab today to get your blood drawn (CBC, CMP, TSH, Lipid profile, HgA1c, Vit D)  Hypertension: -Start taking olmesartan-hydrochlorothiazide  40-12.5 mg daily. -Monitor your blood pressure regularly, and follow up if your readings are consistently above 140/90 mmHg or below 90/60 mmHg. -Please take potassium chloride 10 mEq daily to help counteract the risk of hypokalemia associated with the diuretic component.  Referrals today-  Pharmacy for medication assistance    Please continue to a heart-healthy diet and increase your physical activities. Try to exercise for at least five days a week.    It was a pleasure to see you and I look forward to continuing to work together on your health and well-being. Please do not hesitate to call the office if you need care or have questions about your care.  In case of emergency, please visit the Emergency Department for urgent care, or contact our clinic at [phone] to schedule an appointment. We're here to help you!   Have a wonderful day and week. With Gratitude, Aniza Shor MSN, FNP-BC

## 2024-03-04 ENCOUNTER — Ambulatory Visit: Payer: Self-pay | Admitting: Family Medicine

## 2024-03-04 LAB — CBC WITH DIFFERENTIAL/PLATELET
Basophils Absolute: 0.1 x10E3/uL (ref 0.0–0.2)
Basos: 1 %
EOS (ABSOLUTE): 0.2 x10E3/uL (ref 0.0–0.4)
Eos: 2 %
Hematocrit: 47.2 % — ABNORMAL HIGH (ref 34.0–46.6)
Hemoglobin: 15.4 g/dL (ref 11.1–15.9)
Immature Grans (Abs): 0.1 x10E3/uL (ref 0.0–0.1)
Immature Granulocytes: 1 %
Lymphocytes Absolute: 2.2 x10E3/uL (ref 0.7–3.1)
Lymphs: 18 %
MCH: 29.4 pg (ref 26.6–33.0)
MCHC: 32.6 g/dL (ref 31.5–35.7)
MCV: 90 fL (ref 79–97)
Monocytes Absolute: 0.7 x10E3/uL (ref 0.1–0.9)
Monocytes: 6 %
Neutrophils Absolute: 8.9 x10E3/uL — ABNORMAL HIGH (ref 1.4–7.0)
Neutrophils: 72 %
Platelets: 308 x10E3/uL (ref 150–450)
RBC: 5.24 x10E6/uL (ref 3.77–5.28)
RDW: 13.4 % (ref 11.7–15.4)
WBC: 12.1 x10E3/uL — ABNORMAL HIGH (ref 3.4–10.8)

## 2024-03-04 LAB — LIPID PANEL
Chol/HDL Ratio: 3 ratio (ref 0.0–4.4)
Cholesterol, Total: 195 mg/dL (ref 100–199)
HDL: 65 mg/dL (ref >39)
LDL Chol Calc (NIH): 110 mg/dL — ABNORMAL HIGH (ref 0–99)
Triglycerides: 113 mg/dL (ref 0–149)
VLDL Cholesterol Cal: 20 mg/dL (ref 5–40)

## 2024-03-04 LAB — CMP14+EGFR
ALT: 15 IU/L (ref 0–32)
AST: 19 IU/L (ref 0–40)
Albumin: 4.5 g/dL (ref 3.9–4.9)
Alkaline Phosphatase: 116 IU/L (ref 44–121)
BUN/Creatinine Ratio: 7 — ABNORMAL LOW (ref 12–28)
BUN: 7 mg/dL — ABNORMAL LOW (ref 8–27)
Bilirubin Total: 0.3 mg/dL (ref 0.0–1.2)
CO2: 24 mmol/L (ref 20–29)
Calcium: 9.5 mg/dL (ref 8.7–10.3)
Chloride: 94 mmol/L — ABNORMAL LOW (ref 96–106)
Creatinine, Ser: 1.01 mg/dL — ABNORMAL HIGH (ref 0.57–1.00)
Globulin, Total: 2.7 g/dL (ref 1.5–4.5)
Glucose: 91 mg/dL (ref 70–99)
Potassium: 4.2 mmol/L (ref 3.5–5.2)
Sodium: 135 mmol/L (ref 134–144)
Total Protein: 7.2 g/dL (ref 6.0–8.5)
eGFR: 63 mL/min/1.73 (ref >59)

## 2024-03-04 LAB — VITAMIN D 25 HYDROXY (VIT D DEFICIENCY, FRACTURES): Vit D, 25-Hydroxy: 31.8 ng/mL (ref 30.0–100.0)

## 2024-03-04 LAB — HEMOGLOBIN A1C
Est. average glucose Bld gHb Est-mCnc: 120 mg/dL
Hgb A1c MFr Bld: 5.8 % — ABNORMAL HIGH (ref 4.8–5.6)

## 2024-03-04 LAB — TSH+FREE T4
Free T4: 1.21 ng/dL (ref 0.82–1.77)
TSH: 2.32 u[IU]/mL (ref 0.450–4.500)

## 2024-03-07 ENCOUNTER — Telehealth: Payer: Self-pay

## 2024-03-07 NOTE — Progress Notes (Signed)
 Care Guide Pharmacy Note  03/07/2024 Name: Kerri Walker MRN: [id number] DOB: Jun 17, 1961  Referred By: Zarwolo, Gloria, FNP Reason for referral: Complex Care Management (Outreach to schedule with Pharm d )   Kerri Walker is a 63 y.o. year old female who is a primary care patient of Zarwolo, Gloria, FNP.  Kerri Walker was referred to the pharmacist for assistance related to: COPD  Successful contact was made with the patient to discuss pharmacy services including being ready for the pharmacist to call at least 5 minutes before the scheduled appointment time and to have medication bottles and any blood pressure readings ready for review. The patient agreed to meet with the pharmacist via telephone visit on (date/time).03/08/2024  Jeoffrey Buffalo , RMA     [place]  [facility], [facility] Guide  Direct Dial: [phone]  Website: [email or web address]

## 2024-03-08 ENCOUNTER — Other Ambulatory Visit: Payer: Self-pay

## 2024-03-09 NOTE — Progress Notes (Signed)
   03/09/2024 Name: Kerri Walker MRN: [id number] DOB: 1961/08/23  Chief Complaint  Patient presents with   Medication Assistance    Kerri Walker is a 63 y.o. year old female who presented for a telephone visit.   They were referred to the pharmacist by their PCP for assistance in managing medication access.    Subjective:  Care Team: Primary Care Provider: Zarwolo, Gloria, FNP ; Next Scheduled Visit:  Future Appointments  Date Time Provider Department Center  07/07/2024 10:20 AM Zarwolo, Gloria, FNP [facility]    Medication Access/Adherence  Primary concern is the cost of her inhalers. Reports breztri  is not covered by insurance and that had worked well for her. Trelegy is preferred by current plan however will still be costing her hundreds of dollars a month so that is not feasible.  Current Pharmacy:  [facility] [address], [place] - [address] [place] [address] [place] [address] Phone: [phone] Fax: [phone]   Patient reports affordability concerns with their medications: Yes  Patient reports access/transportation concerns to their pharmacy: No  Patient reports adherence concerns with their medications:  Yes  - cost related    Medication Management:  Current adherence strategy:   Patient reports Good adherence to medications  Patient reports the following barriers to adherence: cost of inahlers  Objective:  Lab Results  Component Value Date   HGBA1C 5.8 (H) 03/03/2024    Lab Results  Component Value Date   CREATININE 1.01 (H) 03/03/2024   BUN 7 (L) 03/03/2024   NA 135 03/03/2024   K 4.2 03/03/2024   CL 94 (L) 03/03/2024   CO2 24 03/03/2024    Lab Results  Component Value Date   CHOL 195 03/03/2024   HDL 65 03/03/2024   LDLCALC 110 (H) 03/03/2024   TRIG 113 03/03/2024   CHOLHDL 3.0 03/03/2024    Medications Reviewed Today   Medications were not reviewed in this encounter       Assessment/Plan:    Medication Management: - Reviewed options for medication assistance. Limited options given patient's insurance provided through [facility] plan.   PAP for trelegy or breztri  - would not qualify due to patients plan being considered commercial by their definition   Savings cards - cannot be applied d/t patients insurance being government funded by their definition  Prescription hope - spoke with representative and said patient could qualify, [employer] plans are not excluded from participating. They only offer trelegy. If approved, patient would be responsible for $60/month. [employer] 1-877-296-HOPE ([phone])   Follow Up Plan: 2 wk follow-up call. Will further review progress with smoking cessation. Down to 6-7 ppd from previous 2ppd, started on nicotine patch 21mg  recently. Will message patient with options on trelegy   Lang Sieve, PharmD, BCGP Clinical Pharmacist  [phone]

## 2024-03-10 ENCOUNTER — Other Ambulatory Visit: Payer: Self-pay | Admitting: Family Medicine

## 2024-03-10 DIAGNOSIS — G8929 Other chronic pain: Secondary | ICD-10-CM

## 2024-03-14 ENCOUNTER — Other Ambulatory Visit: Payer: Self-pay | Admitting: Family Medicine

## 2024-03-14 DIAGNOSIS — J449 Chronic obstructive pulmonary disease, unspecified: Secondary | ICD-10-CM

## 2024-03-17 ENCOUNTER — Other Ambulatory Visit: Payer: Self-pay

## 2024-03-17 DIAGNOSIS — G8929 Other chronic pain: Secondary | ICD-10-CM

## 2024-03-17 DIAGNOSIS — J302 Other seasonal allergic rhinitis: Secondary | ICD-10-CM

## 2024-03-17 MED ORDER — MONTELUKAST SODIUM 10 MG PO TABS: 10.0000 mg | ORAL_TABLET | Freq: Every day | ORAL | 2 refills | Status: DC

## 2024-03-17 MED ORDER — GABAPENTIN 100 MG PO CAPS: 100.0000 mg | ORAL_CAPSULE | Freq: Two times a day (BID) | ORAL | 2 refills | Status: AC

## 2024-03-20 ENCOUNTER — Other Ambulatory Visit: Payer: Self-pay | Admitting: Family Medicine

## 2024-03-20 DIAGNOSIS — J449 Chronic obstructive pulmonary disease, unspecified: Secondary | ICD-10-CM

## 2024-03-22 ENCOUNTER — Other Ambulatory Visit: Payer: Self-pay

## 2024-03-22 NOTE — Progress Notes (Signed)
   03/22/2024  Patient ID: Kerri Walker, female   DOB: 1960-12-06, 63 y.o.   MRN: [id number]   Spoke to patient regarding prescription hope program. Had some issues with logging into the website, so the information was provided to patient and I confirmed she was able to access.   Requested f/u appointment to provide assistance if needed next Friday 8/1.   Future Appointments  Date Time Provider Department Center  04/01/2024 11:00 AM Pandora Cadet, [facility] None  07/07/2024 10:20 AM Zarwolo, Gloria, FNP [facility]     Cadet Pandora, PharmD, BCGP Clinical Pharmacist  [phone]

## 2024-03-29 ENCOUNTER — Other Ambulatory Visit: Payer: Self-pay | Admitting: Family Medicine

## 2024-03-29 DIAGNOSIS — G4700 Insomnia, unspecified: Secondary | ICD-10-CM

## 2024-04-01 ENCOUNTER — Other Ambulatory Visit: Payer: Self-pay

## 2024-04-01 ENCOUNTER — Telehealth: Payer: Self-pay

## 2024-04-01 NOTE — Progress Notes (Signed)
   04/01/2024  Patient ID: Kerri Walker, female   DOB: Jun 13, 1961, 63 y.o.   MRN: [id number]  Attempted to contact patient for scheduled appointment for medication management. Left HIPAA compliant message for patient to return my call at their convenience.   Lang Sieve, PharmD, BCGP Clinical Pharmacist  [phone]

## 2024-04-14 ENCOUNTER — Telehealth (INDEPENDENT_AMBULATORY_CARE_PROVIDER_SITE_OTHER): Payer: Self-pay | Admitting: Family Medicine

## 2024-04-14 ENCOUNTER — Other Ambulatory Visit (HOSPITAL_COMMUNITY): Payer: Self-pay

## 2024-04-14 ENCOUNTER — Telehealth: Payer: Self-pay

## 2024-04-14 ENCOUNTER — Ambulatory Visit: Admitting: Family Medicine

## 2024-04-14 DIAGNOSIS — J449 Chronic obstructive pulmonary disease, unspecified: Secondary | ICD-10-CM

## 2024-04-14 MED ORDER — BREZTRI AEROSPHERE 160-9-4.8 MCG/ACT IN AERO
2.0000 | INHALATION_SPRAY | Freq: Two times a day (BID) | RESPIRATORY_TRACT | Status: DC
Start: 2024-04-14 — End: 2024-04-14

## 2024-04-14 MED ORDER — BREZTRI AEROSPHERE 160-9-4.8 MCG/ACT IN AERO: 2.0000 | INHALATION_SPRAY | Freq: Two times a day (BID) | RESPIRATORY_TRACT | 2 refills | Status: DC

## 2024-04-14 NOTE — Progress Notes (Signed)
 Spoke to pharmacy - received rx for breztri  but could not process d/t trelegy being preferred alternative. Requires PA for breztri .  ___ Attempted to submit PA online through covermymeds however could not accept.  ___ [employer] number to call for PA updates is [phone]. Encounter code is [phone].  Expect to receive determination within 24 hours. They will send to office.  ___ Provided patient with my direct line and to call me tomorrow afternoon if she has not heard anything from office or insurance.

## 2024-04-14 NOTE — Progress Notes (Signed)
   04/14/2024 Name: Kerri Walker MRN: [id number] DOB: 23-Mar-1961  Chief Complaint  Patient presents with   Medication Assistance    Spoke with patient regarding Trelegy and Breztri . Had not sent in the trelegy d/t remembering it was a powder based inhaler and realized that she did not like that in the past. Would be okay with breztri  if the cost could be around $100  Lang Sieve, PharmD, BCGP Clinical Pharmacist  [phone]

## 2024-04-14 NOTE — Progress Notes (Signed)
 Virtual Visit via Video Note  I connected with Kerri Walker on 04/14/24 at  9:40 AM EDT by a video enabled telemedicine application and verified that I am speaking with the correct person using two identifiers.  Patient Location: Home Provider Location: Office/Clinic  I discussed the limitations, risks, security, and privacy concerns of performing an evaluation and management service by video and the availability of in person appointments. I also discussed with the patient that there may be a patient responsible charge related to this service. The patient expressed understanding and agreed to proceed.  Subjective: PCP: Edwyna Dangerfield, FNP  Chief Complaint  Patient presents with   COPD    She is wanting to get her trelegy changed to Breztri . She does not like the powder. Kerri Walker, pharmacist that works with our clinic will help her get on the patient assistance program once she gets the rx    HPI  The patient reports that she did not submit the patient assistance program form for Kerri Walker because she does not wish to be on that medication. Instead, she expresses interest in starting Breztri . She states that she has been using her rescue inhaler frequently to manage her COPD symptoms.   ROS: Per HPI  Current Outpatient Medications:    albuterol  (VENTOLIN  HFA) 108 (90 Base) MCG/ACT inhaler, INHALE 1 TO 2 PUFFS BY MOUTH EVERY 6 HOURS AS NEEDED FOR WHEEZING OR SHORTNESS OF BREATH, Disp: 18 g, Rfl: 0   atorvastatin  (LIPITOR) 10 MG tablet, Take 1 tablet (10 mg total) by mouth daily., Disp: 90 tablet, Rfl: 1   fluticasone  (FLONASE ) 50 MCG/ACT nasal spray, Place 2 sprays into both nostrils daily., Disp: 16 g, Rfl: 2   gabapentin  (NEURONTIN ) 100 MG capsule, Take 1 capsule (100 mg total) by mouth 2 (two) times daily., Disp: 60 capsule, Rfl: 2   hydrOXYzine  (ATARAX ) 10 MG tablet, Take 1 tablet by mouth three times daily as needed, Disp: 90 tablet, Rfl: 0   lidocaine  (LIDODERM ) 5 %, Place  1 patch onto the skin daily as needed. Remove & Discard patch within 12 hours or as directed by MD, Disp: 15 patch, Rfl: 0   montelukast  (SINGULAIR ) 10 MG tablet, Take 1 tablet (10 mg total) by mouth daily., Disp: 30 tablet, Rfl: 2   olmesartan -hydrochlorothiazide  (BENICAR  HCT) 40-12.5 MG tablet, Take 1 tablet by mouth daily., Disp: 90 tablet, Rfl: 1   potassium chloride  (KLOR-CON  M) 10 MEQ tablet, Take 1 tablet (10 mEq total) by mouth daily., Disp: 90 tablet, Rfl: 1   tiZANidine  (ZANAFLEX ) 4 MG tablet, Take 1 tablet (4 mg total) by mouth every 6 (six) hours as needed for muscle spasms., Disp: 20 tablet, Rfl: 0   traZODone  (DESYREL ) 50 MG tablet, TAKE 1 TO 2 TABLETS BY MOUTH AT BEDTIME AS NEEDED FOR SLEEP, Disp: 180 tablet, Rfl: 0   budesonide-glycopyrrolate-formoterol (BREZTRI  AEROSPHERE) 160-9-4.8 MCG/ACT AERO inhaler, Inhale 2 puffs into the lungs 2 (two) times daily., Disp: 5.9 g, Rfl: 2  Observations/Objective: There were no vitals filed for this visit. Physical Exam Patient is well-developed, well-nourished in no acute distress.  Resting comfortably at home.  Head is normocephalic, atraumatic.  No labored breathing.  Speech is clear and coherent with logical content.  Patient is alert and oriented at baseline.   Assessment and Plan: Chronic obstructive pulmonary disease, unspecified COPD type (HCC) -     Breztri  Aerosphere; Inhale 2 puffs into the lungs 2 (two) times daily.  Dispense: 5.9 g; Refill: 2  Follow Up Instructions: No follow-ups on file.   I discussed the assessment and treatment plan with the patient. The patient was provided an opportunity to ask questions, and all were answered. The patient agreed with the plan and demonstrated an understanding of the instructions.   The patient was advised to call back or seek an in-person evaluation if the symptoms worsen or if the condition fails to improve as anticipated.  The above assessment and management plan was discussed  with the patient. The patient verbalized understanding of and has agreed to the management plan.   Pallavi Clifton, FNP

## 2024-04-14 NOTE — Progress Notes (Deleted)
 Established Patient Office Visit  Subjective:  Patient ID: Kerri Walker, female    DOB: 08-20-1961  Age: 63 y.o. MRN: [id number]  CC:  Chief Complaint  Patient presents with   COPD    She is wanting to get her trelegy changed to Breztri . She does not like the powder. Lang Sieve, pharmacist that works with our clinic will help her get on the patient assistance program once she gets the rx     HPI Kerri Walker is a 63 y.o. female with past medical history of *** presents for f/u of *** chronic medical conditions.  Past Medical History:  Diagnosis Date   COPD (chronic obstructive pulmonary disease) (HCC)    Hypertension     Past Surgical History:  Procedure Laterality Date   APPENDECTOMY     CESAREAN SECTION     CHOLECYSTECTOMY     TUBAL LIGATION      Family History  Problem Relation Age of Onset   Emphysema Paternal Grandfather    Stroke Paternal Grandmother    Dementia Father    Miscarriages / [place] Daughter     Social History   Socioeconomic History   Marital status: Married    Spouse name: Not on file   Number of children: Not on file   Years of education: Not on file   Highest education level: Not on file  Occupational History   Not on file  Tobacco Use   Smoking status: Every Day    Current packs/day: 0.00    Types: Cigarettes   Smokeless tobacco: Never  Vaping Use   Vaping status: Never Used  Substance and Sexual Activity   Alcohol use: Yes    Comment: rarely   Drug use: No   Sexual activity: Not Currently    Birth control/protection: Post-menopausal, Surgical    Comment: tubal  Other Topics Concern   Not on file  Social History Narrative   Not on file   Social Drivers of Health   Financial Resource Strain: Not on file  Food Insecurity: Not on file  Transportation Needs: Not on file  Physical Activity: Not on file  Stress: Not on file  Social Connections: Not on file  Intimate Partner Violence: Not on file     Outpatient Medications Prior to Visit  Medication Sig Dispense Refill   albuterol  (VENTOLIN  HFA) 108 (90 Base) MCG/ACT inhaler INHALE 1 TO 2 PUFFS BY MOUTH EVERY 6 HOURS AS NEEDED FOR WHEEZING OR SHORTNESS OF BREATH 18 g 0   atorvastatin  (LIPITOR) 10 MG tablet Take 1 tablet (10 mg total) by mouth daily. 90 tablet 1   fluticasone  (FLONASE ) 50 MCG/ACT nasal spray Place 2 sprays into both nostrils daily. 16 g 2   Fluticasone -Umeclidin-Vilant (TRELEGY ELLIPTA ) 100-62.5-25 MCG/ACT AEPB Inhale 1 puff into the lungs daily. 60 each 11   gabapentin  (NEURONTIN ) 100 MG capsule Take 1 capsule (100 mg total) by mouth 2 (two) times daily. 60 capsule 2   hydrOXYzine  (ATARAX ) 10 MG tablet Take 1 tablet by mouth three times daily as needed 90 tablet 0   lidocaine  (LIDODERM ) 5 % Place 1 patch onto the skin daily as needed. Remove & Discard patch within 12 hours or as directed by MD 15 patch 0   montelukast  (SINGULAIR ) 10 MG tablet Take 1 tablet (10 mg total) by mouth daily. 30 tablet 2   olmesartan -hydrochlorothiazide  (BENICAR  HCT) 40-12.5 MG tablet Take 1 tablet by mouth daily. 90 tablet 1   potassium chloride  (KLOR-CON  M)  10 MEQ tablet Take 1 tablet (10 mEq total) by mouth daily. 90 tablet 1   tiZANidine  (ZANAFLEX ) 4 MG tablet Take 1 tablet (4 mg total) by mouth every 6 (six) hours as needed for muscle spasms. 20 tablet 0   traZODone  (DESYREL ) 50 MG tablet TAKE 1 TO 2 TABLETS BY MOUTH AT BEDTIME AS NEEDED FOR SLEEP 180 tablet 0   promethazine -dextromethorphan (PROMETHAZINE -DM) 6.25-15 MG/5ML syrup Take 5 mLs by mouth 4 (four) times daily as needed. 118 mL 0   No facility-administered medications prior to visit.    Allergies  Allergen Reactions   Vasotec [Enalapril Maleate] Hives    ROS Review of Systems    Objective:    Physical Exam  There were no vitals taken for this visit. Wt Readings from Last 3 Encounters:  03/03/24 151 lb 1.9 oz (68.5 kg)  01/21/24 155 lb (70.3 kg)  10/05/23 155 lb  0.6 oz (70.3 kg)    Lab Results  Component Value Date   TSH 2.320 03/03/2024   Lab Results  Component Value Date   WBC 12.1 (H) 03/03/2024   HGB 15.4 03/03/2024   HCT 47.2 (H) 03/03/2024   MCV 90 03/03/2024   PLT 308 03/03/2024   Lab Results  Component Value Date   NA 135 03/03/2024   K 4.2 03/03/2024   CO2 24 03/03/2024   GLUCOSE 91 03/03/2024   BUN 7 (L) 03/03/2024   CREATININE 1.01 (H) 03/03/2024   BILITOT 0.3 03/03/2024   ALKPHOS 116 03/03/2024   AST 19 03/03/2024   ALT 15 03/03/2024   PROT 7.2 03/03/2024   ALBUMIN 4.5 03/03/2024   CALCIUM  9.5 03/03/2024   ANIONGAP 8 01/21/2024   EGFR 63 03/03/2024   Lab Results  Component Value Date   CHOL 195 03/03/2024   Lab Results  Component Value Date   HDL 65 03/03/2024   Lab Results  Component Value Date   LDLCALC 110 (H) 03/03/2024   Lab Results  Component Value Date   TRIG 113 03/03/2024   Lab Results  Component Value Date   CHOLHDL 3.0 03/03/2024   Lab Results  Component Value Date   HGBA1C 5.8 (H) 03/03/2024      Assessment & Plan:  There are no diagnoses linked to this encounter.  Follow-up: No follow-ups on file.   Kerri Kirchgessner, FNP

## 2024-04-15 ENCOUNTER — Telehealth: Payer: Self-pay | Admitting: Pharmacy Technician

## 2024-04-15 ENCOUNTER — Other Ambulatory Visit (HOSPITAL_BASED_OUTPATIENT_CLINIC_OR_DEPARTMENT_OTHER): Payer: Self-pay

## 2024-04-15 ENCOUNTER — Other Ambulatory Visit: Payer: Self-pay | Admitting: Family Medicine

## 2024-04-15 ENCOUNTER — Other Ambulatory Visit (HOSPITAL_COMMUNITY): Payer: Self-pay

## 2024-04-15 DIAGNOSIS — J449 Chronic obstructive pulmonary disease, unspecified: Secondary | ICD-10-CM

## 2024-04-15 NOTE — Telephone Encounter (Signed)
 Pharmacy Patient Advocate Encounter   Received notification from CoverMyMeds that prior authorization for Breztri  Aerosphere 160-9-4.8MCG/ACT aerosol is required/requested.   Insurance verification completed.   The patient is insured through [facility] .   Per test claim: PA required; PA submitted to above mentioned insurance via Latent Key/confirmation #/EOC [facility] Status is pending

## 2024-04-18 ENCOUNTER — Other Ambulatory Visit: Payer: Self-pay | Admitting: Family Medicine

## 2024-04-18 DIAGNOSIS — J449 Chronic obstructive pulmonary disease, unspecified: Secondary | ICD-10-CM

## 2024-04-18 NOTE — Telephone Encounter (Signed)
 Pharmacy Patient Advocate Encounter  Received notification from [employer] that Prior Authorization for Breztri  Aerosphere 160-9-4.8MCG/ACT aerosol  has been DENIED.  Full denial letter will be uploaded to the media tab. See denial reason below.   PA #/Case ID/Reference #: [id number]

## 2024-04-19 ENCOUNTER — Telehealth: Payer: Self-pay

## 2024-04-19 ENCOUNTER — Other Ambulatory Visit: Payer: Self-pay | Admitting: Family Medicine

## 2024-04-19 DIAGNOSIS — J449 Chronic obstructive pulmonary disease, unspecified: Secondary | ICD-10-CM

## 2024-04-19 MED ORDER — TRELEGY ELLIPTA 100-62.5-25 MCG/ACT IN AEPB: 1.0000 | INHALATION_SPRAY | Freq: Every day | RESPIRATORY_TRACT | 11 refills | Status: AC

## 2024-04-21 NOTE — Progress Notes (Signed)
   04/21/2024 Name: Kerri Walker MRN: [id number] DOB: Jan 10, 1961  Chief Complaint  Patient presents with   Medication Assistance    Returning patients call with question regarding prescription hope. No answer, requested call back.   Lang Sieve, PharmD, BCGP Clinical Pharmacist  [phone]

## 2024-04-21 NOTE — Progress Notes (Signed)
   04/21/2024  Patient ID: Kerri Walker, female   DOB: [birth year], 63 y.o.   MRN: [id number]  Connected with patient. Answered questions regarding prescription hope and insurance coverage. Had consider rx advocates program but appears there is enrollment fee of ~30 and $80/month thereafter compared to prescription hope's $60/month.

## 2024-04-24 ENCOUNTER — Other Ambulatory Visit: Payer: Self-pay | Admitting: Family Medicine

## 2024-04-24 DIAGNOSIS — J449 Chronic obstructive pulmonary disease, unspecified: Secondary | ICD-10-CM

## 2024-04-25 ENCOUNTER — Other Ambulatory Visit: Payer: Self-pay | Admitting: Family Medicine

## 2024-05-13 ENCOUNTER — Other Ambulatory Visit: Payer: Self-pay | Admitting: Family Medicine

## 2024-05-13 DIAGNOSIS — J449 Chronic obstructive pulmonary disease, unspecified: Secondary | ICD-10-CM

## 2024-05-18 ENCOUNTER — Other Ambulatory Visit: Payer: Self-pay | Admitting: Family Medicine

## 2024-05-18 DIAGNOSIS — J449 Chronic obstructive pulmonary disease, unspecified: Secondary | ICD-10-CM

## 2024-06-08 ENCOUNTER — Other Ambulatory Visit: Payer: Self-pay | Admitting: Family Medicine

## 2024-06-08 DIAGNOSIS — J449 Chronic obstructive pulmonary disease, unspecified: Secondary | ICD-10-CM

## 2024-06-15 ENCOUNTER — Other Ambulatory Visit: Payer: Self-pay | Admitting: Family Medicine

## 2024-06-15 DIAGNOSIS — M62838 Other muscle spasm: Secondary | ICD-10-CM

## 2024-06-20 ENCOUNTER — Other Ambulatory Visit: Payer: Self-pay | Admitting: Family Medicine

## 2024-06-20 DIAGNOSIS — G4700 Insomnia, unspecified: Secondary | ICD-10-CM

## 2024-07-02 ENCOUNTER — Other Ambulatory Visit: Payer: Self-pay | Admitting: Family Medicine

## 2024-07-02 DIAGNOSIS — J449 Chronic obstructive pulmonary disease, unspecified: Secondary | ICD-10-CM

## 2024-07-07 ENCOUNTER — Encounter: Payer: Self-pay | Admitting: Family Medicine

## 2024-07-07 ENCOUNTER — Ambulatory Visit: Admitting: Family Medicine

## 2024-07-07 VITALS — BP 145/85 | HR 61 | Ht 60.0 in | Wt 157.0 lb

## 2024-07-07 DIAGNOSIS — L304 Erythema intertrigo: Secondary | ICD-10-CM

## 2024-07-07 DIAGNOSIS — E559 Vitamin D deficiency, unspecified: Secondary | ICD-10-CM

## 2024-07-07 DIAGNOSIS — J449 Chronic obstructive pulmonary disease, unspecified: Secondary | ICD-10-CM

## 2024-07-07 DIAGNOSIS — E038 Other specified hypothyroidism: Secondary | ICD-10-CM | POA: Diagnosis not present

## 2024-07-07 DIAGNOSIS — R7301 Impaired fasting glucose: Secondary | ICD-10-CM | POA: Diagnosis not present

## 2024-07-07 DIAGNOSIS — E7849 Other hyperlipidemia: Secondary | ICD-10-CM

## 2024-07-07 MED ORDER — CLOTRIMAZOLE 1 % EX CREA: 1.0000 | TOPICAL_CREAM | Freq: Two times a day (BID) | CUTANEOUS | 0 refills | Status: AC

## 2024-07-07 NOTE — Patient Instructions (Signed)
 I appreciate the opportunity to provide care to you today!    Follow up:  5 months  Labs: please stop by the lab today to get your blood drawn (CBC, CMP, TSH, Lipid profile, HgA1c, Vit D)  For a Healthier YOU, I Recommend: Reducing your intake of sugar, sodium, carbohydrates, and saturated fats. Increasing your fiber intake by incorporating more whole grains, fruits, and vegetables into your meals. Setting healthy goals with a focus on lowering your consumption of carbs, sugar, and unhealthy fats. Adding variety to your diet by including a wide range of fruits and vegetables. Cutting back on soda and limiting processed foods as much as possible. Staying active: In addition to taking your weight loss medication, aim for at least 150 minutes of moderate-intensity physical activity each week for optimal results.    Please follow up if your symptoms worsen or fail to improve.     Please continue to a heart-healthy diet and increase your physical activities. Try to exercise for at least five days a week.    It was a pleasure to see you and I look forward to continuing to work together on your health and well-being. Please do not hesitate to call the office if you need care or have questions about your care.  In case of emergency, please visit the Emergency Department for urgent care, or contact our clinic at [phone] to schedule an appointment. We're here to help you!   Have a wonderful day and week. With Gratitude, Meade JENEANE Gerlach MSN, FNP-BC, PMHNP-BC

## 2024-07-07 NOTE — Progress Notes (Signed)
 Established Patient Office Visit  Subjective:  Patient ID: Kerri Walker, female    DOB: 01/12/1961  Age: 63 y.o. MRN: [id number]  CC:  Chief Complaint  Patient presents with   Hypertension    Four month follow up, feels exhausted after taking BP medication    Med Change Request    Patient reports that her albuterol  inhaler was suppose to be changed due to using the medication too frequently     HPI Kerri Walker is a 63 y.o. female with past medical history of hypertension,, COPD, hyperlipidemia presents for f/u of  chronic medical conditions.  For the details of today's visit, please refer to the assessment and plan.    Past Medical History:  Diagnosis Date   COPD (chronic obstructive pulmonary disease) (HCC)    Hypertension     Past Surgical History:  Procedure Laterality Date   APPENDECTOMY     CESAREAN SECTION     CHOLECYSTECTOMY     TUBAL LIGATION      Family History  Problem Relation Age of Onset   Emphysema Paternal Grandfather    Stroke Paternal Grandmother    Dementia Father    Miscarriages / Stillbirths Daughter     Social History   Socioeconomic History   Marital status: Married    Spouse name: Not on file   Number of children: Not on file   Years of education: Not on file   Highest education level: Not on file  Occupational History   Not on file  Tobacco Use   Smoking status: Every Day    Current packs/day: 0.00    Types: Cigarettes   Smokeless tobacco: Never  Vaping Use   Vaping status: Never Used  Substance and Sexual Activity   Alcohol use: Yes    Comment: rarely   Drug use: No   Sexual activity: Not Currently    Birth control/protection: Post-menopausal, Surgical    Comment: tubal  Other Topics Concern   Not on file  Social History Narrative   Not on file   Social Drivers of Health   Financial Resource Strain: Not on file  Food Insecurity: Not on file  Transportation Needs: Not on file  Physical Activity: Not on file   Stress: Not on file  Social Connections: Not on file  Intimate Partner Violence: Not on file    Outpatient Medications Prior to Visit  Medication Sig Dispense Refill   albuterol  (VENTOLIN  HFA) 108 (90 Base) MCG/ACT inhaler INHALE 1 TO 2 PUFFS BY MOUTH EVERY 6 HOURS AS NEEDED FOR WHEEZING OR SHORTNESS OF BREATH 18 g 0   atorvastatin  (LIPITOR) 10 MG tablet Take 1 tablet (10 mg total) by mouth daily. 90 tablet 1   fluticasone  (FLONASE ) 50 MCG/ACT nasal spray Place 2 sprays into both nostrils daily. 16 g 2   Fluticasone -Umeclidin-Vilant (TRELEGY ELLIPTA ) 100-62.5-25 MCG/ACT AEPB Inhale 1 puff into the lungs daily. 1 each 11   gabapentin  (NEURONTIN ) 100 MG capsule Take 1 capsule (100 mg total) by mouth 2 (two) times daily. 60 capsule 2   hydrOXYzine  (ATARAX ) 10 MG tablet Take 1 tablet by mouth three times daily as needed 90 tablet 0   lidocaine  (LIDODERM ) 5 % Place 1 patch onto the skin daily as needed. Remove & Discard patch within 12 hours or as directed by MD 15 patch 0   montelukast  (SINGULAIR ) 10 MG tablet Take 1 tablet (10 mg total) by mouth daily. 30 tablet 2   olmesartan -hydrochlorothiazide  (BENICAR  HCT) 40-12.5  MG tablet Take 1 tablet by mouth daily. 90 tablet 1   potassium chloride  (KLOR-CON  M) 10 MEQ tablet Take 1 tablet (10 mEq total) by mouth daily. 90 tablet 1   tiZANidine  (ZANAFLEX ) 4 MG tablet TAKE 1 TABLET BY MOUTH EVERY 6 HOURS AS NEEDED FOR MUSCLE SPASM 20 tablet 0   traZODone  (DESYREL ) 50 MG tablet TAKE 1 TO 2 TABLETS BY MOUTH AT BEDTIME AS NEEDED FOR SLEEP 180 tablet 0   No facility-administered medications prior to visit.    Allergies  Allergen Reactions   Vasotec [Enalapril Maleate] Hives    ROS Review of Systems  Constitutional:  Negative for chills and fever.  Eyes:  Negative for visual disturbance.  Respiratory:  Negative for chest tightness and shortness of breath.   Neurological:  Negative for dizziness and headaches.      Objective:    Physical  Exam HENT:     Head: Normocephalic.     Mouth/Throat:     Mouth: Mucous membranes are moist.  Cardiovascular:     Rate and Rhythm: Normal rate.     Heart sounds: Normal heart sounds.  Pulmonary:     Effort: Pulmonary effort is normal.     Breath sounds: Normal breath sounds.  Neurological:     Mental Status: She is alert.     BP (!) 145/85   Pulse 61   Ht 5' (1.524 m)   Wt 157 lb (71.2 kg)   SpO2 97%   BMI 30.66 kg/m  Wt Readings from Last 3 Encounters:  07/07/24 157 lb (71.2 kg)  03/03/24 151 lb 1.9 oz (68.5 kg)  01/21/24 155 lb (70.3 kg)    Lab Results  Component Value Date   TSH 2.320 03/03/2024   Lab Results  Component Value Date   WBC 12.1 (H) 03/03/2024   HGB 15.4 03/03/2024   HCT 47.2 (H) 03/03/2024   MCV 90 03/03/2024   PLT 308 03/03/2024   Lab Results  Component Value Date   NA 135 03/03/2024   K 4.2 03/03/2024   CO2 24 03/03/2024   GLUCOSE 91 03/03/2024   BUN 7 (L) 03/03/2024   CREATININE 1.01 (H) 03/03/2024   BILITOT 0.3 03/03/2024   ALKPHOS 116 03/03/2024   AST 19 03/03/2024   ALT 15 03/03/2024   PROT 7.2 03/03/2024   ALBUMIN 4.5 03/03/2024   CALCIUM  9.5 03/03/2024   ANIONGAP 8 01/21/2024   EGFR 63 03/03/2024   Lab Results  Component Value Date   CHOL 195 03/03/2024   Lab Results  Component Value Date   HDL 65 03/03/2024   Lab Results  Component Value Date   LDLCALC 110 (H) 03/03/2024   Lab Results  Component Value Date   TRIG 113 03/03/2024   Lab Results  Component Value Date   CHOLHDL 3.0 03/03/2024   Lab Results  Component Value Date   HGBA1C 5.8 (H) 03/03/2024      Assessment & Plan:  Chronic obstructive pulmonary disease, unspecified COPD type (HCC) Assessment & Plan: Patient remains stable with no recent flare-ups reported. No changes made to the current treatment regimen. Encouraged patient to continue treatment as prescribed and maintain adherence. Advised to monitor for any new or worsening symptoms and  follow up as needed. Patient verbalized understanding.     Intertrigo Assessment & Plan: Patient reports increased itching between the skin folds of the lower abdomen. No redness or open lesions noted. Findings consistent with intertrigo / possible fungal irritation. Start clotrimazole 1%  cream, apply BID to affected area. Encouraged patient to keep the skin folds clean and dry. Advised to wear loose-fitting, breathable clothing and avoid moisture buildup. Monitor for signs of redness, pain, or drainage and follow up if symptoms worsen or persist. Patient verbalized understanding.   Orders: -     Clotrimazole; Apply 1 Application topically 2 (two) times daily.  Dispense: 30 g; Refill: 0  IFG (impaired fasting glucose) -     Hemoglobin A1c  Vitamin D  deficiency -     VITAMIN D  25 Hydroxy (Vit-D Deficiency, Fractures)  TSH (thyroid-stimulating hormone deficiency) -     TSH + free T4  Other hyperlipidemia -     Lipid panel -     CMP14+EGFR -     CBC with Differential/Platelet  Note: This chart has been completed using [employer] (consulting) software, and while attempts have been made to ensure accuracy, certain words and phrases may not be transcribed as intended.    Follow-up: Return in about 5 months (around 12/05/2024).   Negar Sieler  Z Bacchus, FNP

## 2024-07-07 NOTE — Assessment & Plan Note (Addendum)
 Patient remains stable with no recent flare-ups reported. No changes made to the current treatment regimen. Encouraged patient to continue treatment as prescribed and maintain adherence. Advised to monitor for any new or worsening symptoms and follow up as needed. Patient verbalized understanding.

## 2024-07-07 NOTE — Assessment & Plan Note (Signed)
 Patient reports increased itching between the skin folds of the lower abdomen. No redness or open lesions noted. Findings consistent with intertrigo / possible fungal irritation. Start clotrimazole 1% cream, apply BID to affected area. Encouraged patient to keep the skin folds clean and dry. Advised to wear loose-fitting, breathable clothing and avoid moisture buildup. Monitor for signs of redness, pain, or drainage and follow up if symptoms worsen or persist. Patient verbalized understanding.

## 2024-07-08 LAB — CMP14+EGFR
ALT: 17 IU/L (ref 0–32)
AST: 21 IU/L (ref 0–40)
Albumin: 4.2 g/dL (ref 3.9–4.9)
Alkaline Phosphatase: 94 IU/L (ref 49–135)
BUN/Creatinine Ratio: 7 — ABNORMAL LOW (ref 12–28)
BUN: 7 mg/dL — ABNORMAL LOW (ref 8–27)
Bilirubin Total: 0.2 mg/dL (ref 0.0–1.2)
CO2: 24 mmol/L (ref 20–29)
Calcium: 9.1 mg/dL (ref 8.7–10.3)
Chloride: 100 mmol/L (ref 96–106)
Creatinine, Ser: 0.97 mg/dL (ref 0.57–1.00)
Globulin, Total: 2.5 g/dL (ref 1.5–4.5)
Glucose: 82 mg/dL (ref 70–99)
Potassium: 4.3 mmol/L (ref 3.5–5.2)
Sodium: 139 mmol/L (ref 134–144)
Total Protein: 6.7 g/dL (ref 6.0–8.5)
eGFR: 66 mL/min/1.73 (ref >59)

## 2024-07-08 LAB — CBC WITH DIFFERENTIAL/PLATELET
Basophils Absolute: 0.1 x10E3/uL (ref 0.0–0.2)
Basos: 1 %
EOS (ABSOLUTE): 0.2 x10E3/uL (ref 0.0–0.4)
Eos: 2 %
Hematocrit: 43.6 % (ref 34.0–46.6)
Hemoglobin: 14.2 g/dL (ref 11.1–15.9)
Immature Grans (Abs): 0.1 x10E3/uL (ref 0.0–0.1)
Immature Granulocytes: 1 %
Lymphocytes Absolute: 2.1 x10E3/uL (ref 0.7–3.1)
Lymphs: 21 %
MCH: 29.7 pg (ref 26.6–33.0)
MCHC: 32.6 g/dL (ref 31.5–35.7)
MCV: 91 fL (ref 79–97)
Monocytes Absolute: 0.6 x10E3/uL (ref 0.1–0.9)
Monocytes: 7 %
Neutrophils Absolute: 6.6 x10E3/uL (ref 1.4–7.0)
Neutrophils: 68 %
Platelets: 281 x10E3/uL (ref 150–450)
RBC: 4.78 x10E6/uL (ref 3.77–5.28)
RDW: 13.4 % (ref 11.7–15.4)
WBC: 9.6 x10E3/uL (ref 3.4–10.8)

## 2024-07-08 LAB — HEMOGLOBIN A1C
Est. average glucose Bld gHb Est-mCnc: 117 mg/dL
Hgb A1c MFr Bld: 5.7 % — ABNORMAL HIGH (ref 4.8–5.6)

## 2024-07-08 LAB — LIPID PANEL
Chol/HDL Ratio: 2.8 ratio (ref 0.0–4.4)
Cholesterol, Total: 145 mg/dL (ref 100–199)
HDL: 51 mg/dL (ref >39)
LDL Chol Calc (NIH): 70 mg/dL (ref 0–99)
Triglycerides: 141 mg/dL (ref 0–149)
VLDL Cholesterol Cal: 24 mg/dL (ref 5–40)

## 2024-07-08 LAB — TSH+FREE T4
Free T4: 1.11 ng/dL (ref 0.82–1.77)
TSH: 3.27 u[IU]/mL (ref 0.450–4.500)

## 2024-07-08 LAB — VITAMIN D 25 HYDROXY (VIT D DEFICIENCY, FRACTURES): Vit D, 25-Hydroxy: 31.6 ng/mL (ref 30.0–100.0)

## 2024-07-17 ENCOUNTER — Ambulatory Visit: Payer: Self-pay | Admitting: Family Medicine

## 2024-07-19 ENCOUNTER — Other Ambulatory Visit: Payer: Self-pay | Admitting: Family Medicine

## 2024-07-19 DIAGNOSIS — J302 Other seasonal allergic rhinitis: Secondary | ICD-10-CM

## 2024-07-20 ENCOUNTER — Ambulatory Visit

## 2024-07-20 VITALS — BP 137/88

## 2024-07-20 DIAGNOSIS — I10 Essential (primary) hypertension: Secondary | ICD-10-CM

## 2024-07-20 NOTE — Progress Notes (Addendum)
 Patient is in office today for a nurse visit for Blood Pressure Check. Patients blood pressure was 137/88

## 2024-07-22 ENCOUNTER — Other Ambulatory Visit: Payer: Self-pay | Admitting: Family Medicine

## 2024-07-22 DIAGNOSIS — J449 Chronic obstructive pulmonary disease, unspecified: Secondary | ICD-10-CM

## 2024-08-14 ENCOUNTER — Other Ambulatory Visit: Payer: Self-pay | Admitting: Family Medicine

## 2024-08-14 DIAGNOSIS — J449 Chronic obstructive pulmonary disease, unspecified: Secondary | ICD-10-CM

## 2024-08-19 ENCOUNTER — Other Ambulatory Visit: Payer: Self-pay | Admitting: Family Medicine

## 2024-08-19 DIAGNOSIS — J302 Other seasonal allergic rhinitis: Secondary | ICD-10-CM

## 2024-09-08 ENCOUNTER — Other Ambulatory Visit: Payer: Self-pay | Admitting: Family Medicine

## 2024-09-08 DIAGNOSIS — J449 Chronic obstructive pulmonary disease, unspecified: Secondary | ICD-10-CM

## 2024-09-16 NOTE — Progress Notes (Signed)
 HIEDI TOUCHTON                                          MRN: [id number]   09/16/2024   The [facility] Quality Team Specialist reviewed this patient medical record for the purposes of chart review for care gap closure. The following were reviewed: abstraction for care gap closure-breast cancer screening.    [facility] Quality Team

## 2024-09-17 ENCOUNTER — Other Ambulatory Visit: Payer: Self-pay | Admitting: Family Medicine

## 2024-09-17 DIAGNOSIS — J302 Other seasonal allergic rhinitis: Secondary | ICD-10-CM

## 2024-09-17 DIAGNOSIS — G4700 Insomnia, unspecified: Secondary | ICD-10-CM

## 2024-09-28 ENCOUNTER — Other Ambulatory Visit: Payer: Self-pay | Admitting: Family Medicine

## 2024-09-28 DIAGNOSIS — J449 Chronic obstructive pulmonary disease, unspecified: Secondary | ICD-10-CM
